# Patient Record
Sex: Male | Born: 1970 | Race: White | Hispanic: No | Marital: Married | Smoking: Never smoker
Health system: Southern US, Community
[De-identification: ages and names within clinical notes are randomized; demographics above are authoritative.]

## PROBLEM LIST (undated history)

## (undated) DIAGNOSIS — R569 Unspecified convulsions: Secondary | ICD-10-CM

## (undated) HISTORY — PX: OTHER SURGICAL HISTORY: SHX169

## (undated) HISTORY — DX: Unspecified convulsions: R56.9

---

## 2013-05-01 ENCOUNTER — Telehealth: Payer: Self-pay | Admitting: Neurology

## 2013-05-01 MED ORDER — PHENYTOIN SODIUM EXTENDED 100 MG PO CAPS: 300.0000 mg | ORAL_CAPSULE | Freq: Every day | ORAL | Status: DC

## 2013-05-01 NOTE — Telephone Encounter (Signed)
The patient called, and he needs a prescription for Dilantin taking 300 mg daily. The patient has not yet been seen through this office, but he has been referred. The patient is going to run out of his medications. I will call in a small prescription for Dilantin.

## 2013-06-08 ENCOUNTER — Encounter: Payer: Self-pay | Admitting: Neurology

## 2013-06-11 ENCOUNTER — Encounter: Payer: Self-pay | Admitting: Neurology

## 2013-06-11 ENCOUNTER — Ambulatory Visit (INDEPENDENT_AMBULATORY_CARE_PROVIDER_SITE_OTHER): Payer: BC Managed Care – PPO | Admitting: Neurology

## 2013-06-11 VITALS — BP 118/84 | HR 80 | Ht 71.0 in | Wt 207.0 lb

## 2013-06-11 DIAGNOSIS — R569 Unspecified convulsions: Secondary | ICD-10-CM | POA: Insufficient documentation

## 2013-06-11 HISTORY — DX: Unspecified convulsions: R56.9

## 2013-06-11 MED ORDER — PHENYTOIN SODIUM EXTENDED 100 MG PO CAPS: 300.0000 mg | ORAL_CAPSULE | Freq: Every day | ORAL | Status: DC

## 2013-06-11 NOTE — Progress Notes (Signed)
Reason for visit: Seizures  Marc Johnson is a 42 y.o. male  History of present illness:  Marc Johnson is a 42 year old right-handed white male with a history of seizures with the onset in 2006. The patient indicates that he will have generalized tonic-clonic seizure events that are preceded by an episode of "zoning out" for about one to 2 minutes. The patient indicates that he may bite his tongue or lose control of the bowels or the bladder. The patient has been placed on Dilantin, and he has done quite well on this medication. The last seizure was in 2008. The patient indicates that he has had 5 total seizures lifetime. The patient recently had a Dilantin level around of 35, and his dosing was reduced from 400 mg daily to 300 mg daily. The patient indicates that a recent Dilantin level was around 18. The patient is feeling well at this time on the medication. The patient indicates that in the past he had a workup for the seizures that included a MRI of the brain, and an EEG study that were normal. The patient indicates that the workup was done in [place], but he has moved to this area more recently. The patient reports that his mother and the maternal grandfather had seizures. The seizures are felt to be genetic in nature. The patient denies any other significant medical issues.  Past Medical History  Diagnosis Date  . Convulsions/seizures 06/11/2013    History reviewed. No pertinent past surgical history.  Family History  Problem Relation Age of Onset  . Seizures Mother   . Seizures Maternal Grandfather     Social history:  reports that he has never smoked. He has never used smokeless tobacco. He reports that he does not drink alcohol or use illicit drugs.  Medications:  No current outpatient prescriptions on file prior to visit.   No current facility-administered medications on file prior to visit.      Allergies  Allergen Reactions  . Dust Mite Extract   . Mold Extract  [Trichophyton]   . Pollen Extract     ROS:  Out of a complete 14 system review of symptoms, the patient complains only of the following symptoms, and all other reviewed systems are negative.  History of seizures Allergies  Blood pressure 118/84, pulse 80, height 5\' 11"  (1.803 m), weight 207 lb (93.895 kg).  Physical Exam  General: The patient is alert and cooperative at the time of the examination.  Head: Pupils are equal, round, and reactive to light. Discs are flat bilaterally.  Neck: The neck is supple, no carotid bruits are noted.  Respiratory: The respiratory examination is clear.  Cardiovascular: The cardiovascular examination reveals a regular rate and rhythm, no obvious murmurs or rubs are noted.  Skin: Extremities are without significant edema.  Neurologic Exam  Mental status:  Cranial nerves: Facial symmetry is present. There is good sensation of the face to pinprick and soft touch bilaterally. The strength of the facial muscles and the muscles to head turning and shoulder shrug are normal bilaterally. Speech is well enunciated, no aphasia or dysarthria is noted. Extraocular movements are full. Visual fields are full.  Motor: The motor testing reveals 5 over 5 strength of all 4 extremities. Good symmetric motor tone is noted throughout.  Sensory: Sensory testing is intact to pinprick, soft touch, vibration sensation, and position sense on all 4 extremities. No evidence of extinction is noted.  Coordination: Cerebellar testing reveals good finger-nose-finger and heel-to-shin bilaterally.  Gait and station: Gait is normal. Tandem gait is normal. Romberg is negative. No drift is seen.  Reflexes: Deep tendon reflexes are symmetric and normal bilaterally, with exception that the ankle jerk reflexes are depressed bilaterally. Toes are downgoing bilaterally.   Assessment/Plan:  1. History seizures  The patient doing well on Dilantin currently, and he indicates that  he had a recent blood level done. The patient will continue on 300 mg of Dilantin daily. The patient is on generic medication. The patient will followup in about one year. He will contact our office if needed. The patient will go on vitamin D supplementation.  Marlan Palau MD 06/11/2013 7:01 PM  [facility] [address] [place], [place] [address]  Phone [phone] Fax [phone]

## 2013-06-11 NOTE — Patient Instructions (Signed)
Begin vitamin D 1000 IU daily. 

## 2013-09-15 ENCOUNTER — Emergency Department (HOSPITAL_COMMUNITY): Payer: BC Managed Care – PPO

## 2013-09-15 ENCOUNTER — Encounter (HOSPITAL_COMMUNITY): Payer: Self-pay | Admitting: Emergency Medicine

## 2013-09-15 ENCOUNTER — Observation Stay (HOSPITAL_COMMUNITY)
Admission: EM | Admit: 2013-09-15 | Discharge: 2013-09-15 | Disposition: A | Discharge status: Home / Self Care | Payer: BC Managed Care – PPO | Attending: Emergency Medicine | Admitting: Emergency Medicine

## 2013-09-15 DIAGNOSIS — M5030 Other cervical disc degeneration, unspecified cervical region: Secondary | ICD-10-CM | POA: Insufficient documentation

## 2013-09-15 DIAGNOSIS — X58XXXA Exposure to other specified factors, initial encounter: Secondary | ICD-10-CM | POA: Insufficient documentation

## 2013-09-15 DIAGNOSIS — G118 Other hereditary ataxias: Secondary | ICD-10-CM | POA: Diagnosis present

## 2013-09-15 DIAGNOSIS — R112 Nausea with vomiting, unspecified: Secondary | ICD-10-CM | POA: Insufficient documentation

## 2013-09-15 DIAGNOSIS — G40909 Epilepsy, unspecified, not intractable, without status epilepticus: Principal | ICD-10-CM | POA: Insufficient documentation

## 2013-09-15 DIAGNOSIS — R569 Unspecified convulsions: Secondary | ICD-10-CM

## 2013-09-15 DIAGNOSIS — S01502A Unspecified open wound of oral cavity, initial encounter: Secondary | ICD-10-CM | POA: Insufficient documentation

## 2013-09-15 DIAGNOSIS — F17200 Nicotine dependence, unspecified, uncomplicated: Secondary | ICD-10-CM | POA: Insufficient documentation

## 2013-09-15 DIAGNOSIS — R42 Dizziness and giddiness: Secondary | ICD-10-CM | POA: Insufficient documentation

## 2013-09-15 DIAGNOSIS — M546 Pain in thoracic spine: Secondary | ICD-10-CM | POA: Insufficient documentation

## 2013-09-15 DIAGNOSIS — W06XXXA Fall from bed, initial encounter: Secondary | ICD-10-CM | POA: Insufficient documentation

## 2013-09-15 LAB — CBC WITH DIFFERENTIAL/PLATELET
BASOS PCT: 0 % (ref 0–1)
Basophils Absolute: 0 10*3/uL (ref 0.0–0.1)
Eosinophils Absolute: 0.1 10*3/uL (ref 0.0–0.7)
Eosinophils Relative: 0 % (ref 0–5)
HCT: 46.6 % (ref 39.0–52.0)
HEMOGLOBIN: 16.3 g/dL (ref 13.0–17.0)
LYMPHS PCT: 8 % — AB (ref 12–46)
Lymphs Abs: 1.1 10*3/uL (ref 0.7–4.0)
MCH: 31.3 pg (ref 26.0–34.0)
MCHC: 35 g/dL (ref 30.0–36.0)
MCV: 89.6 fL (ref 78.0–100.0)
MONO ABS: 1 10*3/uL (ref 0.1–1.0)
MONOS PCT: 7 % (ref 3–12)
NEUTROS PCT: 84 % — AB (ref 43–77)
Neutro Abs: 11.9 10*3/uL — ABNORMAL HIGH (ref 1.7–7.7)
Platelets: 147 10*3/uL — ABNORMAL LOW (ref 150–400)
RBC: 5.2 MIL/uL (ref 4.22–5.81)
RDW: 13.1 % (ref 11.5–15.5)
WBC: 14.1 10*3/uL — AB (ref 4.0–10.5)

## 2013-09-15 LAB — PHENYTOIN LEVEL, TOTAL: PHENYTOIN LVL: 7.1 ug/mL — AB (ref 10.0–20.0)

## 2013-09-15 LAB — BASIC METABOLIC PANEL
BUN: 15 mg/dL (ref 6–23)
CHLORIDE: 103 meq/L (ref 96–112)
CO2: 22 mEq/L (ref 19–32)
CREATININE: 1.01 mg/dL (ref 0.50–1.35)
Calcium: 8.9 mg/dL (ref 8.4–10.5)
GFR calc non Af Amer: 89 mL/min — ABNORMAL LOW (ref >90)
Glucose, Bld: 108 mg/dL — ABNORMAL HIGH (ref 70–99)
POTASSIUM: 4.4 meq/L (ref 3.7–5.3)
Sodium: 142 mEq/L (ref 137–147)

## 2013-09-15 LAB — POCT I-STAT TROPONIN I: Troponin i, poc: 0.02 ng/mL (ref 0.00–0.08)

## 2013-09-15 MED ORDER — ONDANSETRON HCL 4 MG/2ML IJ SOLN: 4.0000 mg | Freq: Three times a day (TID) | INTRAMUSCULAR | Status: DC | PRN

## 2013-09-15 MED ORDER — LORAZEPAM 2 MG/ML IJ SOLN
0.5000 mg | Freq: Once | INTRAMUSCULAR | Status: AC
  Administered 2013-09-15: 0.5 mg via INTRAVENOUS
  Filled 2013-09-15: qty 1

## 2013-09-15 MED ORDER — ONDANSETRON 4 MG PO TBDP: 4.0000 mg | ORAL_TABLET | Freq: Three times a day (TID) | ORAL | Status: DC | PRN

## 2013-09-15 MED ORDER — PHENYTOIN 50 MG PO CHEW: 50.0000 mg | CHEWABLE_TABLET | Freq: Every day | ORAL | Status: DC

## 2013-09-15 MED ORDER — ONDANSETRON 8 MG PO TBDP
8.0000 mg | ORAL_TABLET | Freq: Once | ORAL | Status: AC
  Administered 2013-09-15: 8 mg via ORAL
  Filled 2013-09-15: qty 1

## 2013-09-15 MED ORDER — SODIUM CHLORIDE 0.9 % IV BOLUS (SEPSIS): 1000.0000 mL | Freq: Once | INTRAVENOUS | Status: DC

## 2013-09-15 MED ORDER — HYDROMORPHONE HCL PF 1 MG/ML IJ SOLN: 1.0000 mg | INTRAMUSCULAR | Status: DC | PRN

## 2013-09-15 MED ORDER — ONDANSETRON HCL 4 MG/2ML IJ SOLN: 4.0000 mg | Freq: Once | INTRAMUSCULAR | Status: DC

## 2013-09-15 MED ORDER — SODIUM CHLORIDE 0.9 % IV SOLN
400.0000 mg | Freq: Once | INTRAVENOUS | Status: AC
  Administered 2013-09-15: 400 mg via INTRAVENOUS
  Filled 2013-09-15: qty 8

## 2013-09-15 NOTE — Discharge Instructions (Signed)
Take the prescribed medication as directed in addition to your other dilantin tablets. Follow-up with your neurologist to discuss this ED visit and medication adjustment. Return to the ED for new or worsening symptoms.
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
## 2013-09-15 NOTE — ED Provider Notes (Signed)
Medical screening examination/treatment/procedure(s) were conducted as a shared visit with non-physician practitioner(s) and myself.  I personally evaluated the patient during the encounter.  EKG Interpretation    Date/Time:  Saturday September 15 2013 07:11:42 EST Ventricular Rate:  96 PR Interval:  162 QRS Duration: 95 QT Interval:  339 QTC Calculation: 428 R Axis:   11 Text Interpretation:  Age not entered, assumed to be  43 years old for purpose of ECG interpretation Sinus rhythm Nonspecific T abnormalities, anterior and lateral leads Confirmed by HARRISON  MD, FORREST ([id number]) on 09/15/2013 7:18:58 AM           Pt seen with PA.  H/o seizure disorder, had seizure this am, witnessed by wife.  Pt with minor tongue injury.  Mid back pain.  No missed medications.  Fell from bed.  Plan for ct head, cspine, and thoracic spine films.  Labs, dilantin level pending.    Olivia Mackielga M Trinidad Petron, MD 09/15/13 2056

## 2013-09-15 NOTE — ED Notes (Signed)
Tolerating po fluids

## 2013-09-15 NOTE — ED Notes (Signed)
Bed: RESB Expected date:  Expected time:  Means of arrival:  Comments: seizure 

## 2013-09-15 NOTE — ED Notes (Signed)
Pt was getting up to d/c home.  Became nauseated and vomited at bedside.  Orthostatic vs completed. Notified PA.  Ok to start with po challenge, po med and cancel IV.  Pt given gingerale.  Wife at bedside.
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
## 2013-09-15 NOTE — ED Notes (Signed)
Pt in radiology via stretcher at this time.

## 2013-09-15 NOTE — ED Notes (Signed)
Seizure pads in place. Pt has been assessed by MD

## 2013-09-15 NOTE — ED Provider Notes (Signed)
CSN: [id number]     Arrival date & time 09/15/13  4098 History   None    Chief Complaint  Patient presents with  . Seizures   (Consider location/radiation/quality/duration/timing/severity/associated sxs/prior Treatment) The history is provided by the patient and medical records.   This is a 43 y.o. M with PMH significant for seizure d/o presenting to the ED via EMS for witnessed seizure PTA.  Pt awoke wife around 0540 with garbled speech and rigid movements.  Pt then fell off the bed, unsure of head trauma or LOC.  On EMS arrival, pt was post-ictal with limited responsiveness.  On arrival to the ED, pt still appears somewhat disoriented.  Pt has evidence of oral trauma and urinary incontinence.  Pt states he does not recall any events leading up to ER arrival.  Pt is complaining of some back pain and nausea.  Pt takes dilantin 300mg  daily.  Pt was previously on 400mg  daily but states his levels were too high so was reduced to 300mg  daily which he states this has been working well for him-- last seizure was in 2008.  Denies any recent EtOH or illicit drug use.  Denies any chest pain, SOB, or palpitations. Neurology-- Dr. Anne Hahn with [facility]  Past Medical History  Diagnosis Date  . Convulsions/seizures 06/11/2013   History reviewed. No pertinent past surgical history. Family History  Problem Relation Age of Onset  . Seizures Mother   . Seizures Maternal Grandfather    History  Substance Use Topics  . Smoking status: Current Every Day Smoker  . Smokeless tobacco: Never Used  . Alcohol Use: No    Review of Systems  Neurological: Positive for seizures.  All other systems reviewed and are negative.    Allergies  Dust mite extract; Mold extract; and Pollen extract  Home Medications   Current Outpatient Rx  Name  Route  Sig  Dispense  Refill  . cholecalciferol (VITAMIN D) 1000 UNITS tablet   Oral   Take 1,000 Units by mouth daily.         . phenytoin (DILANTIN) 100  MG ER capsule   Oral   Take 3 capsules (300 mg total) by mouth daily.   90 capsule   11    BP 112/75  Pulse 86  Temp(Src) 97.8 F (36.6 C) (Rectal)  Resp 20  SpO2 100%  Physical Exam  Nursing note and vitals reviewed. Constitutional: He is oriented to person, place, and time. He appears well-developed and well-nourished. No distress.  HENT:  Head: Normocephalic and atraumatic.  Mouth/Throat: Uvula is midline and oropharynx is clear and moist. Mucous membranes are dry. Lacerations present.    Laceration to anterior tip of tongue without active bleeding; dried blood on lips and roof of mouth; dry mucus membranes  Eyes: Conjunctivae and EOM are normal. Pupils are equal, round, and reactive to light.  Neck: Normal range of motion. Neck supple.  Cardiovascular: Normal rate, regular rhythm and normal heart sounds.   Pulmonary/Chest: Effort normal and breath sounds normal. No respiratory distress. He has no wheezes.  Abdominal: Soft. Bowel sounds are normal. There is no tenderness. There is no guarding.  Musculoskeletal: Normal range of motion.       Thoracic back: He exhibits tenderness, bony tenderness and pain. He exhibits normal range of motion, no swelling, no edema, no deformity, no laceration, no spasm and normal pulse.  TTP of lower thoracic spine; no midline step-off or gross deformity; full ROM maintained with some pain; moves  BLE without ataxia; distal sensation intact  Neurological: He is alert and oriented to person, place, and time. He has normal strength. He displays no tremor. No cranial nerve deficit or sensory deficit. He displays no seizure activity.  AAOx3, answering questions appropriately; equal strength UE and LE bilaterally; CN grossly intact; moves all extremities appropriately without ataxia; no focal neuro deficits or facial asymmetry appreciated  Skin: Skin is warm and dry. He is not diaphoretic.  Psychiatric: He has a normal mood and affect.    ED Course    Procedures (including critical care time) Labs Review Labs Reviewed  CBC WITH DIFFERENTIAL - Abnormal; Notable for the following:    WBC 14.1 (*)    Platelets 147 (*)    Neutrophils Relative % 84 (*)    Neutro Abs 11.9 (*)    Lymphocytes Relative 8 (*)    All other components within normal limits  BASIC METABOLIC PANEL - Abnormal; Notable for the following:    Glucose, Bld 108 (*)    GFR calc non Af Amer 89 (*)    All other components within normal limits  PHENYTOIN LEVEL, TOTAL - Abnormal; Notable for the following:    Phenytoin Lvl 7.1 (*)    All other components within normal limits  POCT I-STAT TROPONIN I   Imaging Review Dg Thoracic Spine 2 View  09/15/2013   CLINICAL DATA:  Fall, back pain  EXAM: THORACIC SPINE - 2 VIEW  COMPARISON:  None.  FINDINGS: There is no evidence of thoracic spine fracture. Alignment is normal. No other significant bone abnormalities are identified.  IMPRESSION: Negative.   Electronically Signed   By: Ruel Favorsrevor  Shick M.D.   On: 09/15/2013 07:09   Ct Head Wo Contrast  09/15/2013   CLINICAL DATA:  Episode of garbled speech, possible seizure, and fall from bed approximately 1-1/2 hr ago. Patient unresponsive upon EMS arrival.  EXAM: CT HEAD WITHOUT CONTRAST  CT CERVICAL SPINE WITHOUT CONTRAST  TECHNIQUE: Multidetector CT imaging of the head and cervical spine was performed following the standard protocol without intravenous contrast. Multiplanar CT image reconstructions of the cervical spine were also generated.  COMPARISON:  None.  FINDINGS: CT HEAD FINDINGS  Ventricular system normal in size and appearance for age. No mass lesion. No midline shift. No acute hemorrhage or hematoma. No extra-axial fluid collections. No acute infarction. No focal brain parenchymal abnormality.  No skull fractures or other focal osseous abnormality involving the skull. Visualized paranasal sinuses, bilateral mastoid air cells, and bilateral middle ear cavities well-aerated.  CT  CERVICAL SPINE FINDINGS  No fractures identified involving the cervical spine. Sagittal reconstructed images demonstrate anatomic posterior alignment. No evidence of spinal stenosis. Neural foramina widely patent throughout. Facet joints intact throughout. Mild disc space narrowing at C5-6. Remaining disc spaces well preserved, and no significant disc protrusion identified on the soft tissue windows. Coronal reformatted images demonstrate an intact craniocervical junction, intact dens and intact lateral masses throughout.  IMPRESSION: 1. Normal unenhanced CT head. 2. No cervical spine fractures identified. Mild disc space narrowing at C5-6 without significant disc protrusion.   Electronically Signed   By: Hulan Saashomas  Lawrence M.D.   On: 09/15/2013 07:19   Ct Cervical Spine Wo Contrast  09/15/2013   CLINICAL DATA:  Episode of garbled speech, possible seizure, and fall from bed approximately 1-1/2 hr ago. Patient unresponsive upon EMS arrival.  EXAM: CT HEAD WITHOUT CONTRAST  CT CERVICAL SPINE WITHOUT CONTRAST  TECHNIQUE: Multidetector CT imaging of the head and cervical  spine was performed following the standard protocol without intravenous contrast. Multiplanar CT image reconstructions of the cervical spine were also generated.  COMPARISON:  None.  FINDINGS: CT HEAD FINDINGS  Ventricular system normal in size and appearance for age. No mass lesion. No midline shift. No acute hemorrhage or hematoma. No extra-axial fluid collections. No acute infarction. No focal brain parenchymal abnormality.  No skull fractures or other focal osseous abnormality involving the skull. Visualized paranasal sinuses, bilateral mastoid air cells, and bilateral middle ear cavities well-aerated.  CT CERVICAL SPINE FINDINGS  No fractures identified involving the cervical spine. Sagittal reconstructed images demonstrate anatomic posterior alignment. No evidence of spinal stenosis. Neural foramina widely patent throughout. Facet joints intact  throughout. Mild disc space narrowing at C5-6. Remaining disc spaces well preserved, and no significant disc protrusion identified on the soft tissue windows. Coronal reformatted images demonstrate an intact craniocervical junction, intact dens and intact lateral masses throughout.  IMPRESSION: 1. Normal unenhanced CT head. 2. No cervical spine fractures identified. Mild disc space narrowing at C5-6 without significant disc protrusion.   Electronically Signed   By: Hulan Saas M.D.   On: 09/15/2013 07:19    EKG Interpretation    Date/Time:  Saturday September 15 2013 07:11:42 EST Ventricular Rate:  96 PR Interval:  162 QRS Duration: 95 QT Interval:  339 QTC Calculation: 428 R Axis:   11 Text Interpretation:  Age not entered, assumed to be  43 years old for purpose of ECG interpretation Sinus rhythm Nonspecific T abnormalities, anterior and lateral leads Confirmed by HARRISON  MD, FORREST ([id number]) on 09/15/2013 7:18:58 AM            MDM   1. Seizure    Pt with witness tonic-clonic seizure activity at home resulting in fall from bed.  Pt somewhat disoriented on arrival but beginning to become more alert.  Will obtain CT head and CS, thoracic x-ray due to fall and basic blood work including dilantin level.  Pt given 0.5mg  ativan.  Will continue to monitor closely.  CT head and CS negative for acute findings.  EKG NSR, no acute ischemic chang.es Labs as above-- dilantin level low at 7.1.  Discussed case with on call neurology, Dr. Susanne Borders to load with 400mg  Dilantin IV, increase home daily dose to 350mg .  11:47 AM Dilantin has finished.  Pt has been resting comfortably in room without further tremors or seizure activity.  Pt states he feels ok, just tired.  Will increase medications per neurology recommendations.  Pt will FU with his neurologist, Dr. Anne Hahn as soon as possible to discuss this ED visit and medication changes.  Pt will be on driving restrictions until cleared by  neurology.  Discussed plan of care with pt, he acknowledged understanding and agreed.  Return precautions advised.  12:36 PM At time of discharge, pt stood up got somewhat dizzy, nauseated with an episode of vomiting.  Orthostatic VS positive.  Will give additional fluids and zofran.  1:55 PM Pt able to fully stand without recurrent dizziness, nausea, or vomiting.  Pt states he feels fine, just remains sleepy.  Neuro exam remains WNL without concerning focal deficits.  Pt will be discharged with prior FU plan and restrictions.  Garlon Hatchet, PA-C 09/15/13 1440

## 2013-09-17 ENCOUNTER — Telehealth: Payer: Self-pay | Admitting: Neurology

## 2013-09-17 NOTE — Telephone Encounter (Signed)
I called and made appt for pt on Wednesday with L Lam, NP at 0930 RV post seizure on Saturday last.

## 2013-09-17 NOTE — Telephone Encounter (Signed)
Calling back to change phone number to cell if not reached on work number-Melissa

## 2013-09-19 ENCOUNTER — Ambulatory Visit (INDEPENDENT_AMBULATORY_CARE_PROVIDER_SITE_OTHER): Payer: BC Managed Care – PPO | Admitting: Nurse Practitioner

## 2013-09-19 ENCOUNTER — Encounter: Payer: Self-pay | Admitting: Nurse Practitioner

## 2013-09-19 VITALS — BP 127/78 | HR 84

## 2013-09-19 DIAGNOSIS — R569 Unspecified convulsions: Secondary | ICD-10-CM

## 2013-09-19 MED ORDER — PHENYTOIN 50 MG PO CHEW: 50.0000 mg | CHEWABLE_TABLET | Freq: Every day | ORAL | Status: DC

## 2013-09-19 NOTE — Progress Notes (Signed)
PATIENT: Marc Johnson DOB: 07/26/1971   REASON FOR VISIT: follow up for epilepsy HISTORY FROM: patient  HISTORY OF PRESENT ILLNESS: Marc Johnson is a 43 year old right-handed white male with a history of seizures with the onset in 2006. The patient indicates that he will have generalized tonic-clonic seizure events that are preceded by an episode of "zoning out" for about one to 2 minutes. The patient indicates that he may bite his tongue or lose control of the bowels or the bladder. The patient has been placed on Dilantin, and he has done quite well on this medication. The last seizure was in 2008. The patient indicates that he has had 5 total seizures lifetime. The patient recently had a Dilantin level around of 35, and his dosing was reduced from 400 mg daily to 300 mg daily. The patient indicates that a recent Dilantin level was around 18. The patient is feeling well at this time on the medication. The patient indicates that in the past he had a workup for the seizures that included a MRI of the brain, and an EEG study that were normal. The patient indicates that the workup was done in [place], but he has moved to this area more recently. The patient reports that his mother and the maternal grandfather had seizures. The seizures are felt to be genetic in nature. The patient denies any other significant medical issues.  UPDATE Patient was taken to ER on 09/15/13, via EMS for witnessed seizure PTA. Pt awoke wife around 0540 with garbled speech and rigid movements. Pt then fell off the bed, unsure of head trauma or LOC. On EMS arrival, pt was post-ictal with limited responsiveness. On arrival to the ED, pt was somewhat disoriented. Pt had evidence of oral trauma and urinary incontinence. Pt states he does not recall any events leading up to ER arrival.  Pt was taking dilantin 300mg  daily. Pt was previously on 400mg  daily but states his levels were too high so was reduced to 300mg  daily which he  states this has been working well for him -- last seizure was in 2008. Denies any recent EtOH or illicit drug use.  Denies missed doses.  He was however fasting for religious reasons, only eating one meal per day in evening.  REVIEW OF SYSTEMS: Full 14 system review of systems performed and notable only for:  Neurological: seizure  ALLERGIES: Allergies  Allergen Reactions  . Dust Mite Extract   . Mold Extract [Trichophyton]   . Pollen Extract     HOME MEDICATIONS: Outpatient Prescriptions Prior to Visit  Medication Sig Dispense Refill  . cholecalciferol (VITAMIN D) 1000 UNITS tablet Take 1,000 Units by mouth daily.      . ondansetron (ZOFRAN ODT) 4 MG disintegrating tablet Take 1 tablet (4 mg total) by mouth every 8 (eight) hours as needed for nausea.  10 tablet  0  . phenytoin (DILANTIN) 100 MG ER capsule Take 300 mg by mouth at bedtime.      . phenytoin (DILANTIN) 50 MG tablet Chew 1 tablet (50 mg total) by mouth daily.  30 tablet  0   No facility-administered medications prior to visit.    PAST MEDICAL HISTORY: Past Medical History  Diagnosis Date  . Convulsions/seizures 06/11/2013    PAST SURGICAL HISTORY: Past Surgical History  Procedure Laterality Date  . None      FAMILY HISTORY: Family History  Problem Relation Age of Onset  . Seizures Mother   . Seizures Maternal Grandfather  SOCIAL HISTORY: History   Social History  . Marital Status: Married    Spouse Name: Efraim Kaufmann    Number of Children: 1  . Years of Education: college   Occupational History  .  Time [employer]   Social History Main Topics  . Smoking status: Never Smoker   . Smokeless tobacco: Never Used  . Alcohol Use: No  . Drug Use: No  . Sexual Activity: Not on file   Other Topics Concern  . Not on file   Social History Narrative  . No narrative on file     PHYSICAL EXAM  Filed Vitals:   09/19/13 0941  BP: 127/78  Pulse: 84   There is no weight on file to calculate  BMI.  Generalized: Well developed, in no acute distress  Head: normocephalic and atraumatic. Oropharynx benign  Neck: Supple, no carotid bruits  Cardiac: Regular rate rhythm, no murmur  Musculoskeletal: No deformity   Neurological examination   Mentation: Alert oriented to time, place, history taking. Follows all commands speech and language fluent Cranial nerve II-XII: Fundoscopic exam reveals sharp disc margins.Pupils were equal round reactive to light extraocular movements were full, visual field were full on confrontational test. Facial sensation and strength were normal. hearing was intact to finger rubbing bilaterally. Uvula tongue midline. head turning and shoulder shrug and were normal and symmetric.Tongue protrusion into cheek strength was normal. Motor: normal bulk and tone, full strength in the BUE, BLE, fine finger movements normal, no pronator drift. No focal weakness Sensory: normal and symmetric to light touch, pinprick, and  vibration  Coordination: finger-nose-finger, heel-to-shin bilaterally, no dysmetria Reflexes:  Deep tendon reflexes in the upper and lower extremities are present and symmetric.  Gait and Station: Rising up from seated position without assistance, normal stance, without trunk ataxia, moderate stride, good arm swing, smooth turning, able to perform tiptoe, and heel walking without difficulty.   DIAGNOSTIC DATA (LABS, IMAGING, TESTING) - I reviewed patient records, labs, notes, testing and imaging myself where available.  Lab Results  Component Value Date   WBC 14.1* 09/15/2013   HGB 16.3 09/15/2013   HCT 46.6 09/15/2013   MCV 89.6 09/15/2013   PLT 147* 09/15/2013      Component Value Date/Time   NA 142 09/15/2013 0721   K 4.4 09/15/2013 0721   CL 103 09/15/2013 0721   CO2 22 09/15/2013 0721   GLUCOSE 108* 09/15/2013 0721   BUN 15 09/15/2013 0721   CREATININE 1.01 09/15/2013 0721   CALCIUM 8.9 09/15/2013 0721   GFRNONAA 89* 09/15/2013 0721   GFRAA >90  09/15/2013 0721   Phenytoin 09/15/13  7.1  ASSESSMENT AND PLAN 43 y.o. year old male  has a past medical history of Convulsions/seizures (06/11/2013) here with recent breakthrough seizure after several days of eating only one meal per day.  Phenytoin was increased from 300 mg to 350 mg.  PLAN: Continue Phenytion ER 300 mg daily at bedtime.  Start taking your Phenytoin 50 mg in the morning.  Take Vitamin  D supplement separately from Phenytoin by 2 hours. We will check Phenytoin blood level today. Acccording to the [facility], no driving until 6 months seizure-free. Follow up in 6 months with Dr. Anne Hahn, sooner as needed.  Orders Placed This Encounter  Procedures  . Phenytoin Level, Total   Meds ordered this encounter  Medications  . phenytoin (DILANTIN) 50 MG tablet    Sig: Chew 1 tablet (50 mg total) by mouth daily. In the Morning.  Dispense:  90 tablet    Refill:  3    Order Specific Question:  Supervising Provider    Answer:  York Spaniel [[id number]]   Return in about 6 months (around 03/19/2014).  Ronal Fear, MSN, NP-C 09/19/2013, 11:56 AM [facility] [address], [address] [place], [place] [address] [phone]  Note: This document was prepared with digital dictation and possible smart phrase technology. Any transcriptional errors that result from this process are unintentional.

## 2013-09-19 NOTE — Patient Instructions (Signed)
Continue Phenytion ER 300 mg  Daily at bedtime.  Start taking your Phenytoin 50 mg in the morning.  We will check blood level today.  Follow up in 6 months with Dr. Anne HahnWillis, sooner as needed.

## 2013-09-20 LAB — PHENYTOIN LEVEL, TOTAL: Phenytoin Lvl: 11.4 ug/mL (ref 10.0–20.0)

## 2013-09-20 NOTE — Progress Notes (Signed)
Quick Note:  Spoke to patient and relayed Phenytoin level at 11.4, therapeutic range, per [employer]. He will keep current dosage. ______

## 2014-02-18 ENCOUNTER — Encounter: Payer: Self-pay | Admitting: Neurology

## 2014-02-18 ENCOUNTER — Telehealth: Payer: Self-pay | Admitting: Neurology

## 2014-02-18 ENCOUNTER — Ambulatory Visit (INDEPENDENT_AMBULATORY_CARE_PROVIDER_SITE_OTHER): Payer: BC Managed Care – PPO | Admitting: Neurology

## 2014-02-18 VITALS — BP 115/72 | HR 72 | Wt 210.0 lb

## 2014-02-18 DIAGNOSIS — R569 Unspecified convulsions: Secondary | ICD-10-CM

## 2014-02-18 MED ORDER — PHENYTOIN SODIUM EXTENDED 100 MG PO CAPS: 300.0000 mg | ORAL_CAPSULE | Freq: Every day | ORAL | Status: DC

## 2014-02-18 MED ORDER — PHENYTOIN 50 MG PO CHEW: 50.0000 mg | CHEWABLE_TABLET | Freq: Every day | ORAL | Status: DC

## 2014-02-18 NOTE — Telephone Encounter (Signed)
Appointment scheduled.

## 2014-02-18 NOTE — Telephone Encounter (Signed)
Pt's wife called would like for someone to call her back to see if pt can get in today, had to call 911 this morning thought he might had a mild seizure, but like to get some things check. Please call pt's wife to call back concerning this matter. Thanks

## 2014-02-18 NOTE — Patient Instructions (Signed)
Epilepsy Epilepsy is a disorder in which a person has repeated seizures over time. A seizure is a release of abnormal electrical activity in the brain. Seizures can cause a change in attention, behavior, or the ability to remain awake and alert (altered mental status). Seizures often involve uncontrollable shaking (convulsions).  Most people with epilepsy lead normal lives. However, people with epilepsy are at an increased risk of falls, accidents, and injuries. Therefore, it is important to begin treatment right away. CAUSES  Epilepsy has many possible causes. Anything that disturbs the normal pattern of brain cell activity can lead to seizures. This may include:   Head injury.  Birth trauma.  High fever as a child.  Stroke.  Bleeding into or around the brain.  Certain drugs.  Prolonged low oxygen, such as what occurs after CPR efforts.  Abnormal brain development.  Certain illnesses, such as meningitis, encephalitis (brain infection), malaria, and other infections.  An imbalance of nerve signaling chemicals (neurotransmitters).  SIGNS AND SYMPTOMS  The symptoms of a seizure can vary greatly from one person to another. Right before a seizure, you may have a warning (aura) that a seizure is about to occur. An aura may include the following symptoms:  Fear or anxiety.  Nausea.  Feeling like the room is spinning (vertigo).  Vision changes, such as seeing flashing lights or spots. Common symptoms during a seizure include:  Abnormal sensations, such as an abnormal smell or a bitter taste in the mouth.   Sudden, general body stiffness.   Convulsions that involve rhythmic jerking of the face, arm, or leg on one or both sides.   Sudden change in consciousness.   Appearing to be awake but not responding.   Appearing to be asleep but cannot be awakened.   Grimacing, chewing, lip smacking, drooling, tongue biting, or loss of bowel or bladder control. After a seizure,  you may feel sleepy for a while. DIAGNOSIS  Your health care provider will ask about your symptoms and take a medical history. Descriptions from any witnesses to your seizures will be very helpful in the diagnosis. A physical exam, including a detailed neurological exam, is necessary. Various tests may be done, such as:   An electroencephalogram (EEG). This is a painless test of your brain waves. In this test, a diagram is created of your brain waves. These diagrams can be interpreted by a specialist.  An MRI of the brain.   A CT scan of the brain.   A spinal tap (lumbar puncture, LP).  Blood tests to check for signs of infection or abnormal blood chemistry. TREATMENT  There is no cure for epilepsy, but it is generally treatable. Once epilepsy is diagnosed, it is important to begin treatment as soon as possible. For most people with epilepsy, seizures can be controlled with medicines. The following may also be used:  A pacemaker for the brain (vagus nerve stimulator) can be used for people with seizures that are not well controlled by medicine.  Surgery on the brain. For some people, epilepsy eventually goes away. HOME CARE INSTRUCTIONS   Follow your health care provider's recommendations on driving and safety in normal activities.  Get enough rest. Lack of sleep can cause seizures.  Only take over-the-counter or prescription medicines as directed by your health care provider. Take any prescribed medicine exactly as directed.  Avoid any known triggers of your seizures.  Keep a seizure diary. Record what you recall about any seizure, especially any possible trigger.   Make   sure the people you live and work with know that you are prone to seizures. They should receive instructions on how to help you. In general, a witness to a seizure should:   Cushion your head and body.   Turn you on your side.   Avoid unnecessarily restraining you.   Not place anything inside your  mouth.   Call for emergency medical help if there is any question about what has occurred.   Follow up with your health care provider as directed. You may need regular blood tests to monitor the levels of your medicine.  SEEK MEDICAL CARE IF:   You develop signs of infection or other illness. This might increase the risk of a seizure.   You seem to be having more frequent seizures.   Your seizure pattern is changing.  SEEK IMMEDIATE MEDICAL CARE IF:   You have a seizure that does not stop after a few moments.   You have a seizure that causes any difficulty in breathing.   You have a seizure that results in a very severe headache.   You have a seizure that leaves you with the inability to speak or use a part of your body.  Document Released: 08/23/2005 Document Revised: 06/13/2013 Document Reviewed: 04/04/2013 [employer] Patient Information 2014 [employer].  

## 2014-02-18 NOTE — Progress Notes (Signed)
Reason for visit: Seizures  Marc Johnson is an 43 y.o. male  History of present illness:  Marc Johnson is a 43 year old right-handed white male with a history of seizures. The patient has a family history of seizures, they are felt to be genetic in origin. The patient has been on Dilantin, well controlled for quite a number of years. In January 2015, the patient had a seizure out of sleep associated with tongue biting and bladder incontinence. The patient was increased on the Dilantin taking 350 mg daily. On 400 mg day, the patient was running toxic Dilantin levels. The patient however, has not been taking the 50 mg tablet regularly, and he essentially has been on only 300 mg of Dilantin daily. He suffered another seizure last evening at 2:30 AM. The patient was confused for about 15 minutes. The patient will sit up out of bed, and he will be unresponsive for several minutes. The patient had no generalized jerking, no tongue biting, no bowel or bladder incontinence. The patient indicates that he is been under increased stress with his work. The patient has been getting adequate sleep. The patient has been operating a motor vehicle.  Past Medical History  Diagnosis Date  . Convulsions/seizures 06/11/2013    Past Surgical History  Procedure Laterality Date  . None      Family History  Problem Relation Age of Onset  . Seizures Mother   . Seizures Maternal Grandfather     Social history:  reports that he has never smoked. He has never used smokeless tobacco. He reports that he does not drink alcohol or use illicit drugs.    Allergies  Allergen Reactions  . Dust Mite Extract   . Mold Extract [Trichophyton]   . Pollen Extract     Medications:  Current Outpatient Prescriptions on File Prior to Visit  Medication Sig Dispense Refill  . cholecalciferol (VITAMIN D) 1000 UNITS tablet Take 1,000 Units by mouth daily.      . ondansetron (ZOFRAN ODT) 4 MG disintegrating tablet Take 1  tablet (4 mg total) by mouth every 8 (eight) hours as needed for nausea.  10 tablet  0   No current facility-administered medications on file prior to visit.    ROS:  Out of a complete 14 system review of symptoms, the patient complains only of the following symptoms, and all other reviewed systems are negative.  Appetite change Acting out drains Environmental allergies Seizures  Blood pressure 115/72, pulse 72, weight 210 lb (95.255 kg).  Physical Exam  General: The patient is alert and cooperative at the time of the examination.  Skin: No significant peripheral edema is noted.   Neurologic Exam  Mental status: The patient is oriented x 3.  Cranial nerves: Facial symmetry is present. Speech is normal, no aphasia or dysarthria is noted. Extraocular movements are full. Visual fields are full.  Motor: The patient has good strength in all 4 extremities.  Sensory examination: Soft touch sensation is symmetric on the face, arms, and legs.  Coordination: The patient has good finger-nose-finger and heel-to-shin bilaterally.  Gait and station: The patient has a normal gait. Tandem gait is normal. Romberg is negative. No drift is seen.  Reflexes: Deep tendon reflexes are symmetric.   Assessment/Plan:  1. History seizures  The patient's had recurrence of seizures. He has not been on the 350 mg dosing of Dilantin. We will have him go back to 350 mg in the evening of Dilantin, and he will followup  in July 2015. We will check blood levels at that time. The patient is not to operate a motor vehicle until further notice, we will wait at least 6 months before letting him drive again.   Marc Johnson. Keith Avyaan Summer MD 02/18/2014 7:47 PM  [facility] [address] [place], [place] [id number]  Phone [phone] Fax [phone]

## 2014-03-22 ENCOUNTER — Encounter (INDEPENDENT_AMBULATORY_CARE_PROVIDER_SITE_OTHER): Payer: Self-pay

## 2014-03-22 ENCOUNTER — Encounter: Payer: Self-pay | Admitting: Neurology

## 2014-03-22 ENCOUNTER — Ambulatory Visit (INDEPENDENT_AMBULATORY_CARE_PROVIDER_SITE_OTHER): Payer: BC Managed Care – PPO | Admitting: Neurology

## 2014-03-22 VITALS — BP 115/82 | HR 84 | Wt 208.0 lb

## 2014-03-22 DIAGNOSIS — R569 Unspecified convulsions: Secondary | ICD-10-CM

## 2014-03-22 DIAGNOSIS — Z5181 Encounter for therapeutic drug level monitoring: Secondary | ICD-10-CM

## 2014-03-22 NOTE — Progress Notes (Signed)
    Reason for visit: Seizures  Marc ReamsBrian Abishai Johnson is an 43 y.o. male  History of present illness:  Marc Johnson is a 43 year old right-handed white male with a history of seizures. The patient has last had a seizure on 02/17/2014. The patient had a low therapeutic level of Dilantin. He has been increased to a 300 mg dose of Dilantin. He has had some problems with concentration while working, but otherwise has not had any problems with balance. He is allergic to brand name Dilantin, but not to the generic phenytoin. The patient otherwise is doing well, no recurrent seizures have been noted since last seen. He is not currently operating a motor vehicle.  Past Medical History  Diagnosis Date  . Convulsions/seizures 06/11/2013    Past Surgical History  Procedure Laterality Date  . None      Family History  Problem Relation Age of Onset  . Seizures Mother   . Seizures Maternal Grandfather     Social history:  reports that he has never smoked. He has never used smokeless tobacco. He reports that he does not drink alcohol or use illicit drugs.    Allergies  Allergen Reactions  . Dust Mite Extract   . Mold Extract [Trichophyton]   . Pollen Extract     Medications:  Current Outpatient Prescriptions on File Prior to Visit  Medication Sig Dispense Refill  . cholecalciferol (VITAMIN D) 1000 UNITS tablet Take 1,000 Units by mouth daily.      . ondansetron (ZOFRAN ODT) 4 MG disintegrating tablet Take 1 tablet (4 mg total) by mouth every 8 (eight) hours as needed for nausea.  10 tablet  0  . phenytoin (DILANTIN) 100 MG ER capsule Take 3 capsules (300 mg total) by mouth at bedtime.  270 capsule  3  . phenytoin (DILANTIN) 50 MG tablet Chew 1 tablet (50 mg total) by mouth daily. In the Morning.  90 tablet  3   No current facility-administered medications on file prior to visit.    ROS:  Out of a complete 14 system review of symptoms, the patient complains only of the following symptoms,  and all other reviewed systems are negative.  Environmental allergies Frequent waking, snoring, shift work Hyperactive  Blood pressure 115/82, pulse 84, weight 208 lb (94.348 kg).  Physical Exam  General: The patient is alert and cooperative at the time of the examination.  Skin: No significant peripheral edema is noted.   Neurologic Exam  Mental status: The patient is oriented x 3.  Cranial nerves: Facial symmetry is present. Speech is normal, no aphasia or dysarthria is noted. Extraocular movements are full. Visual fields are full.  Motor: The patient has good strength in all 4 extremities.  Sensory examination: Soft touch sensation is symmetric on the face, arms, or legs.  Coordination: The patient has good finger-nose-finger and heel-to-shin bilaterally.  Gait and station: The patient has a normal gait. Tandem gait is normal. Romberg is negative. No drift is seen.  Reflexes: Deep tendon reflexes are symmetric.   Assessment/Plan:  1. History seizures  The patient will be sent for a Dilantin level today. This does represent a trough level for him. The patient will followup otherwise in 4 months. He is not to operate a motor vehicle for 6 months following his last seizure in June 2015.  Marc Palau. Marc Willis MD 03/24/2014 9:47 AM  [facility] [address] [place], [place] [id number]  Phone [phone] Fax [phone]

## 2014-03-22 NOTE — Patient Instructions (Signed)

## 2014-03-23 LAB — PHENYTOIN LEVEL, TOTAL: Phenytoin Lvl: 14.8 ug/mL (ref 10.0–20.0)

## 2014-03-25 NOTE — Progress Notes (Signed)
Quick Note:  Shared lab results with patient and he verbalized understanding ______

## 2014-06-11 ENCOUNTER — Ambulatory Visit: Payer: BC Managed Care – PPO | Admitting: Nurse Practitioner

## 2014-12-29 ENCOUNTER — Encounter (HOSPITAL_COMMUNITY): Payer: Self-pay | Admitting: *Deleted

## 2014-12-29 ENCOUNTER — Emergency Department (HOSPITAL_COMMUNITY): Payer: BLUE CROSS/BLUE SHIELD

## 2014-12-29 ENCOUNTER — Emergency Department (HOSPITAL_COMMUNITY)
Admission: EM | Admit: 2014-12-29 | Discharge: 2014-12-29 | Disposition: A | Discharge status: Home / Self Care | Payer: BLUE CROSS/BLUE SHIELD | Attending: Emergency Medicine | Admitting: Emergency Medicine

## 2014-12-29 DIAGNOSIS — R569 Unspecified convulsions: Secondary | ICD-10-CM | POA: Insufficient documentation

## 2014-12-29 DIAGNOSIS — Z79899 Other long term (current) drug therapy: Secondary | ICD-10-CM | POA: Insufficient documentation

## 2014-12-29 DIAGNOSIS — J209 Acute bronchitis, unspecified: Secondary | ICD-10-CM | POA: Insufficient documentation

## 2014-12-29 DIAGNOSIS — R05 Cough: Secondary | ICD-10-CM | POA: Diagnosis present

## 2014-12-29 DIAGNOSIS — J40 Bronchitis, not specified as acute or chronic: Secondary | ICD-10-CM

## 2014-12-29 MED ORDER — DEXAMETHASONE SODIUM PHOSPHATE 10 MG/ML IJ SOLN
5.0000 mg | Freq: Once | INTRAMUSCULAR | Status: AC
  Administered 2014-12-29: 5 mg via INTRAMUSCULAR
  Filled 2014-12-29: qty 1

## 2014-12-29 MED ORDER — IBUPROFEN 800 MG PO TABS
800.0000 mg | ORAL_TABLET | Freq: Once | ORAL | Status: AC
  Administered 2014-12-29: 800 mg via ORAL
  Filled 2014-12-29: qty 1

## 2014-12-29 MED ORDER — ALBUTEROL SULFATE HFA 108 (90 BASE) MCG/ACT IN AERS
2.0000 | INHALATION_SPRAY | Freq: Once | RESPIRATORY_TRACT | Status: AC
  Administered 2014-12-29: 2 via RESPIRATORY_TRACT
  Filled 2014-12-29: qty 6.7

## 2014-12-29 NOTE — ED Provider Notes (Signed)
CSN: [id number]     Arrival date & time 12/29/14  1016 History   First MD Initiated Contact with Patient 12/29/14 1025     Chief Complaint  Patient presents with  . Cough  . Fever     (Consider location/radiation/quality/duration/timing/severity/associated sxs/prior Treatment) HPI  Marc Johnson is a 44 y.o. male with PMH of seizures presenting with one week of cough productive of thick green sputum as well as fevers with MAXIMUM TEMPERATURE of 101.8. Patient presented to medical clinic 6 days ago and was started on doxycycline. He should states his cough is worse with lying down has been unrelieved by over-the-counter cold medications. Patient also endorses rhinorrhea but no sore throat or shortness of breath. No chest pain.   Past Medical History  Diagnosis Date  . Convulsions/seizures 06/11/2013   Past Surgical History  Procedure Laterality Date  . None     Family History  Problem Relation Age of Onset  . Seizures Mother   . Seizures Maternal Grandfather    History  Substance Use Topics  . Smoking status: Never Smoker   . Smokeless tobacco: Never Used  . Alcohol Use: No    Review of Systems  Constitutional: Positive for fever and chills.  HENT: Positive for congestion and rhinorrhea. Negative for ear pain and sore throat.   Respiratory: Positive for cough. Negative for shortness of breath.   Gastrointestinal: Negative for nausea, vomiting and diarrhea.      Allergies  Dust mite extract; Mold extract; and Pollen extract  Home Medications   Prior to Admission medications   Medication Sig Start Date End Date Taking? Authorizing Provider  cholecalciferol (VITAMIN D) 1000 UNITS tablet Take 1,000 Units by mouth daily.   Yes Historical Provider, MD  doxycycline (VIBRAMYCIN) 100 MG capsule Take 100 mg by mouth 2 (two) times daily.   Yes Historical Provider, MD  phenytoin (DILANTIN) 100 MG ER capsule Take 3 capsules (300 mg total) by mouth at bedtime. 02/18/14  Yes  York Spanielharles K Willis, MD  ondansetron (ZOFRAN ODT) 4 MG disintegrating tablet Take 1 tablet (4 mg total) by mouth every 8 (eight) hours as needed for nausea. Patient not taking: Reported on 12/29/2014 09/15/13   Garlon HatchetLisa M Sanders, PA-C  phenytoin (DILANTIN) 50 MG tablet Chew 1 tablet (50 mg total) by mouth daily. In the Morning. Patient not taking: Reported on 12/29/2014 02/18/14   York Spanielharles K Willis, MD   BP 127/80 mmHg  Pulse 99  Temp(Src) 100.2 F (37.9 C) (Oral)  Resp 18  Ht 5\' 11"  (1.803 m)  Wt 208 lb (94.348 kg)  BMI 29.02 kg/m2  SpO2 100% Physical Exam  Constitutional: He appears well-developed and well-nourished. No distress.  HENT:  Head: Normocephalic and atraumatic.  Nose: Right sinus exhibits no maxillary sinus tenderness and no frontal sinus tenderness. Left sinus exhibits no maxillary sinus tenderness and no frontal sinus tenderness.  Mouth/Throat: Mucous membranes are normal. No oropharyngeal exudate, posterior oropharyngeal edema or posterior oropharyngeal erythema.  No trismus or uvula deviation.  Eyes: Conjunctivae and EOM are normal. Right eye exhibits no discharge. Left eye exhibits no discharge.  Neck: Normal range of motion. Neck supple.  Cardiovascular: Normal rate, regular rhythm and normal heart sounds.   Pulmonary/Chest: Effort normal and breath sounds normal. No respiratory distress. He has no wheezes. He has no rales.  Abdominal: Soft. Bowel sounds are normal. He exhibits no distension. There is no tenderness.  Lymphadenopathy:    He has cervical adenopathy.  Neurological: He is  alert.  Skin: Skin is warm and dry. He is not diaphoretic.  Nursing note and vitals reviewed.   ED Course  Procedures (including critical care time) Labs Review Labs Reviewed - No data to display  Imaging Review Dg Chest 2 View  12/29/2014   CLINICAL DATA:  Intermittently productive cough and fever for the past week.  EXAM: CHEST  2 VIEW  COMPARISON:  Thoracic spine radiographs dated  09/15/2013.  FINDINGS: Normal sized heart. Clear lungs. Mild diffuse peribronchial thickening. Unremarkable bones.  IMPRESSION: Mild bronchitic changes.   Electronically Signed   By: Beckie Salts M.D.   On: 12/29/2014 12:12     EKG Interpretation None      Meds given in ED:  Medications  dexamethasone (DECADRON) injection 5 mg (not administered)  ibuprofen (ADVIL,MOTRIN) tablet 800 mg (not administered)  albuterol (PROVENTIL HFA;VENTOLIN HFA) 108 (90 BASE) MCG/ACT inhaler 2 puff (2 puffs Inhalation Given 12/29/14 1141)    New Prescriptions   No medications on file      MDM   Final diagnoses:  Bronchitis   Patient presenting with one week of cough productive of thick sputum as well as low-grade fevers. Patient currently taking doxycycline. VSS. Chest x-ray without evidence of pneumonia. Patient likely with bronchitis. Patient to follow-up with PCP for persistent symptoms.  Discussed return precautions with patient. Discussed all results and patient verbalizes understanding and agrees with plan.     Oswaldo Conroy, PA-C 12/29/14 1220  Kristen N Ward, DO 12/29/14 1312

## 2014-12-29 NOTE — Discharge Instructions (Signed)
Return to the emergency room with worsening of symptoms, new symptoms or with symptoms that are concerning , especially fevers, stiff neck, worsening headache, nausea/vomiting, visual changes or slurred speech, chest pain, shortness of breath, cough with thick colored mucous or blood Drink plenty of fluids with electrolytes especially Gatorade. OTC cold medications such as mucinex, nyquil, dayquil are recommended. Chloraseptic for sore throat. Continue take the remainder of your doxycycline. Use your inhaler for cough. Continue to alternate Tylenol and ibuprofen. Read below information and follow recommendations. Acute Bronchitis Bronchitis is inflammation of the airways that extend from the windpipe into the lungs (bronchi). The inflammation often causes mucus to develop. This leads to a cough, which is the most common symptom of bronchitis.  In acute bronchitis, the condition usually develops suddenly and goes away over time, usually in a couple weeks. Smoking, allergies, and asthma can make bronchitis worse. Repeated episodes of bronchitis may cause further lung problems.  CAUSES Acute bronchitis is most often caused by the same virus that causes a cold. The virus can spread from person to person (contagious) through coughing, sneezing, and touching contaminated objects. SIGNS AND SYMPTOMS   Cough.   Fever.   Coughing up mucus.   Body aches.   Chest congestion.   Chills.   Shortness of breath.   Sore throat.  DIAGNOSIS  Acute bronchitis is usually diagnosed through a physical exam. Your health care provider will also ask you questions about your medical history. Tests, such as chest X-rays, are sometimes done to rule out other conditions.  TREATMENT  Acute bronchitis usually goes away in a couple weeks. Oftentimes, no medical treatment is necessary. Medicines are sometimes given for relief of fever or cough. Antibiotic medicines are usually not needed but may be prescribed  in certain situations. In some cases, an inhaler may be recommended to help reduce shortness of breath and control the cough. A cool mist vaporizer may also be used to help thin bronchial secretions and make it easier to clear the chest.  HOME CARE INSTRUCTIONS  Get plenty of rest.   Drink enough fluids to keep your urine clear or pale yellow (unless you have a medical condition that requires fluid restriction). Increasing fluids may help thin your respiratory secretions (sputum) and reduce chest congestion, and it will prevent dehydration.   Take medicines only as directed by your health care provider.  If you were prescribed an antibiotic medicine, finish it all even if you start to feel better.  Avoid smoking and secondhand smoke. Exposure to cigarette smoke or irritating chemicals will make bronchitis worse. If you are a smoker, consider using nicotine gum or skin patches to help control withdrawal symptoms. Quitting smoking will help your lungs heal faster.   Reduce the chances of another bout of acute bronchitis by washing your hands frequently, avoiding people with cold symptoms, and trying not to touch your hands to your mouth, nose, or eyes.   Keep all follow-up visits as directed by your health care provider.  SEEK MEDICAL CARE IF: Your symptoms do not improve after 1 week of treatment.  SEEK IMMEDIATE MEDICAL CARE IF:  You develop an increased fever or chills.   You have chest pain.   You have severe shortness of breath.  You have bloody sputum.   You develop dehydration.  You faint or repeatedly feel like you are going to pass out.  You develop repeated vomiting.  You develop a severe headache. MAKE SURE YOU:   Understand these instructions.  Will watch your condition.  Will get help right away if you are not doing well or get worse. Document Released: 09/30/2004 Document Revised: 01/07/2014 Document Reviewed: 02/13/2013 [facility] Patient Information  2015 [place], [place]. This information is not intended to replace advice given to you by your health care provider. Make sure you discuss any questions you have with your health care provider.

## 2014-12-29 NOTE — ED Notes (Signed)
Pt reports productive cough with green sputum x 1 week and running a fever since Monday. Started on antibiotics on Tuesday with no relief.

## 2014-12-29 NOTE — ED Notes (Signed)
Patient transported to X-ray 

## 2015-03-06 ENCOUNTER — Other Ambulatory Visit: Payer: Self-pay

## 2015-03-06 MED ORDER — PHENYTOIN SODIUM EXTENDED 100 MG PO CAPS: 300.0000 mg | ORAL_CAPSULE | Freq: Every day | ORAL | Status: DC

## 2015-03-06 MED ORDER — PHENYTOIN 50 MG PO CHEW: 50.0000 mg | CHEWABLE_TABLET | Freq: Every day | ORAL | Status: DC

## 2015-04-03 ENCOUNTER — Ambulatory Visit (INDEPENDENT_AMBULATORY_CARE_PROVIDER_SITE_OTHER): Payer: BLUE CROSS/BLUE SHIELD | Admitting: Neurology

## 2015-04-03 ENCOUNTER — Encounter: Payer: Self-pay | Admitting: Neurology

## 2015-04-03 VITALS — BP 118/87 | HR 82 | Ht 71.0 in | Wt 203.0 lb

## 2015-04-03 DIAGNOSIS — R569 Unspecified convulsions: Secondary | ICD-10-CM

## 2015-04-03 DIAGNOSIS — Z5181 Encounter for therapeutic drug level monitoring: Secondary | ICD-10-CM | POA: Diagnosis not present

## 2015-04-03 MED ORDER — PHENYTOIN 50 MG PO CHEW: 50.0000 mg | CHEWABLE_TABLET | Freq: Every day | ORAL | Status: DC

## 2015-04-03 MED ORDER — PHENYTOIN SODIUM EXTENDED 100 MG PO CAPS: 300.0000 mg | ORAL_CAPSULE | Freq: Every day | ORAL | Status: DC

## 2015-04-03 NOTE — Progress Notes (Signed)
    Reason for visit: Seizures  Marc Johnson is an 44 y.o. male  History of present illness:  Marc Johnson is a 44 year old right-handed white male with a history of well-controlled seizures. He is on generic phenytoin, and he tolerates the medication well. He has not had any seizures since last seen. 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 The patient reports no new medical issues that have come up since last seen. He indicates that he is not taking his vitamin D supplementation regularly.  Past Medical History  Diagnosis Date  . Convulsions/seizures 06/11/2013    Past Surgical History  Procedure Laterality Date  . None      Family History  Problem Relation Age of Onset  . Seizures Mother   . Seizures Maternal Grandfather     Social history:  reports that he has never smoked. He has never used smokeless tobacco. He reports that he does not drink alcohol or use illicit drugs.    Allergies  Allergen Reactions  . Dust Mite Extract   . Mold Extract [Trichophyton]   . Pollen Extract   . Amoxicillin-Pot Clavulanate Other (See Comments)    Medications:  Prior to Admission medications   Medication Sig Start Date End Date Taking? Authorizing Provider  phenytoin (DILANTIN) 100 MG ER capsule Take 3 capsules (300 mg total) by mouth at bedtime. 03/06/15  Yes Marc Spaniel, MD  phenytoin (DILANTIN) 50 MG tablet Chew 1 tablet (50 mg total) by mouth daily. In the Morning. 03/06/15  Yes Marc Spaniel, MD  cholecalciferol (VITAMIN D) 1000 UNITS tablet Take 1,000 Units by mouth daily.    Historical Provider, MD    ROS:  Out of a complete 14 system review of symptoms, the patient complains only of the following symptoms, and all other reviewed systems are negative.  Frequent waking Snoring  Blood pressure 118/87, pulse 82, height  (1.803 m), weight 203 lb (92.08 kg).  Physical Exam  General: The patient is alert and cooperative at the  time of the examination.  Skin: No significant peripheral edema is noted.   Neurologic Exam  Mental status: The patient is alert and oriented x 3 at the time of the examination. The patient has apparent normal recent and remote memory, with an apparently normal attention span and concentration ability.   Cranial nerves: Facial symmetry is present. Speech is normal, no aphasia or dysarthria is noted. Extraocular movements are full. Visual fields are full.  Motor: The patient has good strength in all 4 extremities.  Sensory examination: Soft touch sensation is symmetric on the face, arms, and legs.  Coordination: The patient has good finger-nose-finger and heel-to-shin bilaterally.  Gait and station: The patient has a normal gait. Tandem gait is normal. Romberg is negative. No drift is seen.  Reflexes: Deep tendon reflexes are symmetric.   Assessment/Plan:  1. History seizures  The patient is well controlled on the seizures. We will continue the medication for now, a prescription was called into his pharmacy, and we will check blood work today. He will follow-up in one year, sooner if needed.  Marc Palau MD 04/03/2015 8:11 PM  [facility] [address] [place], [place] [address]  Phone [phone] Fax [phone]

## 2015-04-03 NOTE — Patient Instructions (Signed)

## 2015-04-04 LAB — COMPREHENSIVE METABOLIC PANEL
ALT: 26 IU/L (ref 0–44)
AST: 20 IU/L (ref 0–40)
Albumin/Globulin Ratio: 1.4 (ref 1.1–2.5)
Albumin: 4.2 g/dL (ref 3.5–5.5)
Alkaline Phosphatase: 111 IU/L (ref 39–117)
BUN/Creatinine Ratio: 12 (ref 9–20)
BUN: 11 mg/dL (ref 6–24)
Bilirubin Total: 0.2 mg/dL (ref 0.0–1.2)
CALCIUM: 9.4 mg/dL (ref 8.7–10.2)
CHLORIDE: 102 mmol/L (ref 97–108)
CO2: 26 mmol/L (ref 18–29)
CREATININE: 0.9 mg/dL (ref 0.76–1.27)
GFR, EST AFRICAN AMERICAN: 118 mL/min/{1.73_m2} (ref >59)
GFR, EST NON AFRICAN AMERICAN: 102 mL/min/{1.73_m2} (ref >59)
GLUCOSE: 114 mg/dL — AB (ref 65–99)
Globulin, Total: 3 g/dL (ref 1.5–4.5)
POTASSIUM: 4.5 mmol/L (ref 3.5–5.2)
Sodium: 142 mmol/L (ref 134–144)
Total Protein: 7.2 g/dL (ref 6.0–8.5)

## 2015-04-04 LAB — CBC WITH DIFFERENTIAL/PLATELET
BASOS ABS: 0.1 10*3/uL (ref 0.0–0.2)
Basos: 1 %
EOS (ABSOLUTE): 0.2 10*3/uL (ref 0.0–0.4)
Eos: 3 %
HEMOGLOBIN: 15.5 g/dL (ref 12.6–17.7)
Hematocrit: 45 % (ref 37.5–51.0)
IMMATURE GRANS (ABS): 0 10*3/uL (ref 0.0–0.1)
IMMATURE GRANULOCYTES: 0 %
LYMPHS ABS: 1.6 10*3/uL (ref 0.7–3.1)
Lymphs: 25 %
MCH: 30.7 pg (ref 26.6–33.0)
MCHC: 34.4 g/dL (ref 31.5–35.7)
MCV: 89 fL (ref 79–97)
MONOCYTES: 7 %
Monocytes Absolute: 0.5 10*3/uL (ref 0.1–0.9)
NEUTROS PCT: 64 %
Neutrophils Absolute: 4.2 10*3/uL (ref 1.4–7.0)
PLATELETS: 192 10*3/uL (ref 150–379)
RBC: 5.05 x10E6/uL (ref 4.14–5.80)
RDW: 14.5 % (ref 12.3–15.4)
WBC: 6.5 10*3/uL (ref 3.4–10.8)

## 2015-04-04 LAB — PHENYTOIN LEVEL, TOTAL: Phenytoin (Dilantin), Serum: 13 ug/mL (ref 10.0–20.0)

## 2015-09-29 ENCOUNTER — Other Ambulatory Visit: Payer: Self-pay

## 2015-09-29 ENCOUNTER — Telehealth: Payer: Self-pay | Admitting: Neurology

## 2015-09-29 MED ORDER — PHENYTOIN 50 MG PO CHEW: 50.0000 mg | CHEWABLE_TABLET | Freq: Every day | ORAL | Status: DC

## 2015-09-29 MED ORDER — PHENYTOIN SODIUM EXTENDED 100 MG PO CAPS: 300.0000 mg | ORAL_CAPSULE | Freq: Every day | ORAL | Status: DC

## 2015-09-29 NOTE — Telephone Encounter (Signed)
Rx sent 

## 2015-09-29 NOTE — Telephone Encounter (Signed)
Patient called to request refills of phenytoin (DILANTIN) 100 MG ER capsule and phenytoin (DILANTIN) 50 MG tablet

## 2016-02-11 IMAGING — DX DG CHEST 2V
2 series · 2 of 2 positions shown · non-contrast
Comparison: Thoracic spine radiographs dated 09/15/2013.

CLINICAL DATA: Intermittently productive cough and fever for the
past week.

EXAM:
CHEST  2 VIEW

[chest pa]
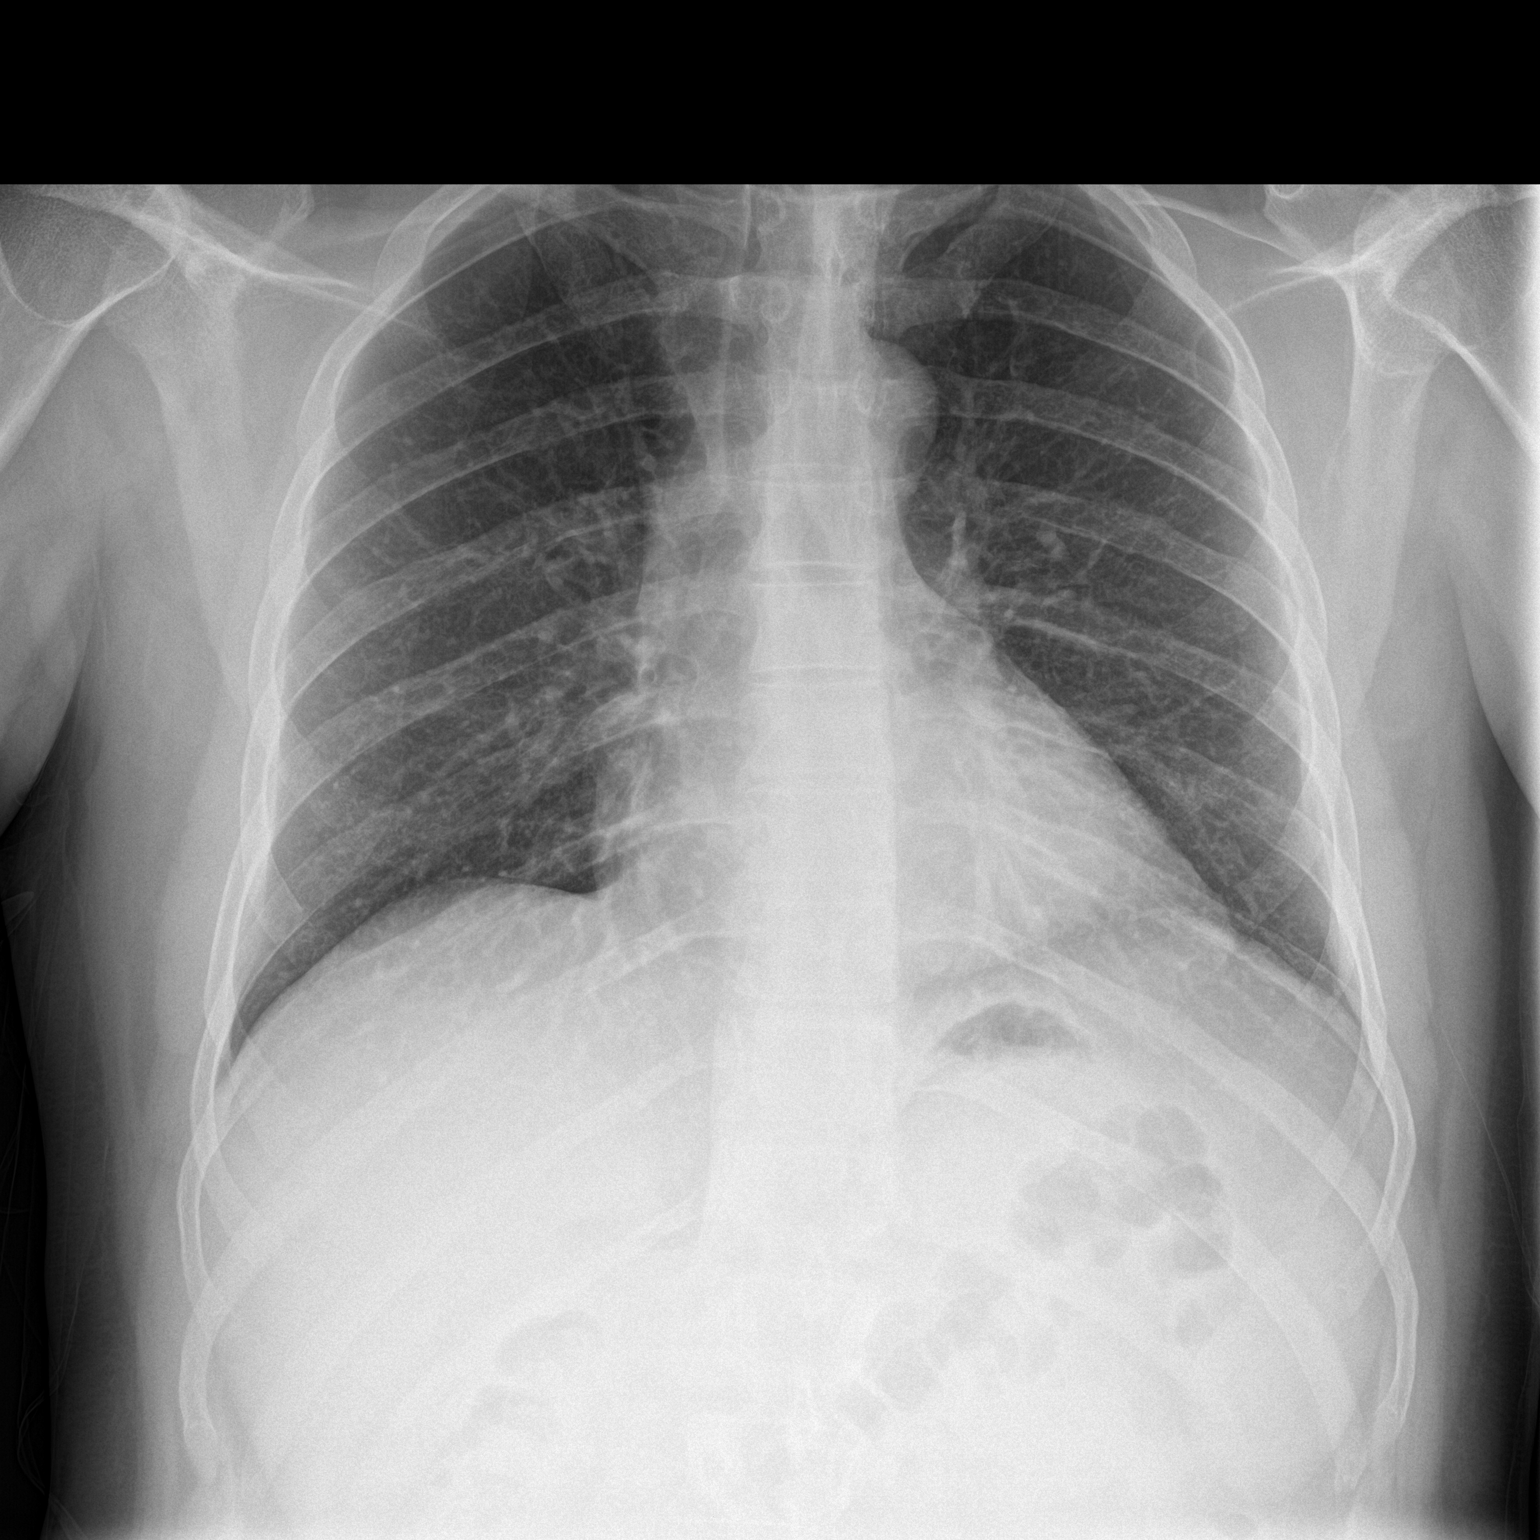

[chest lat]
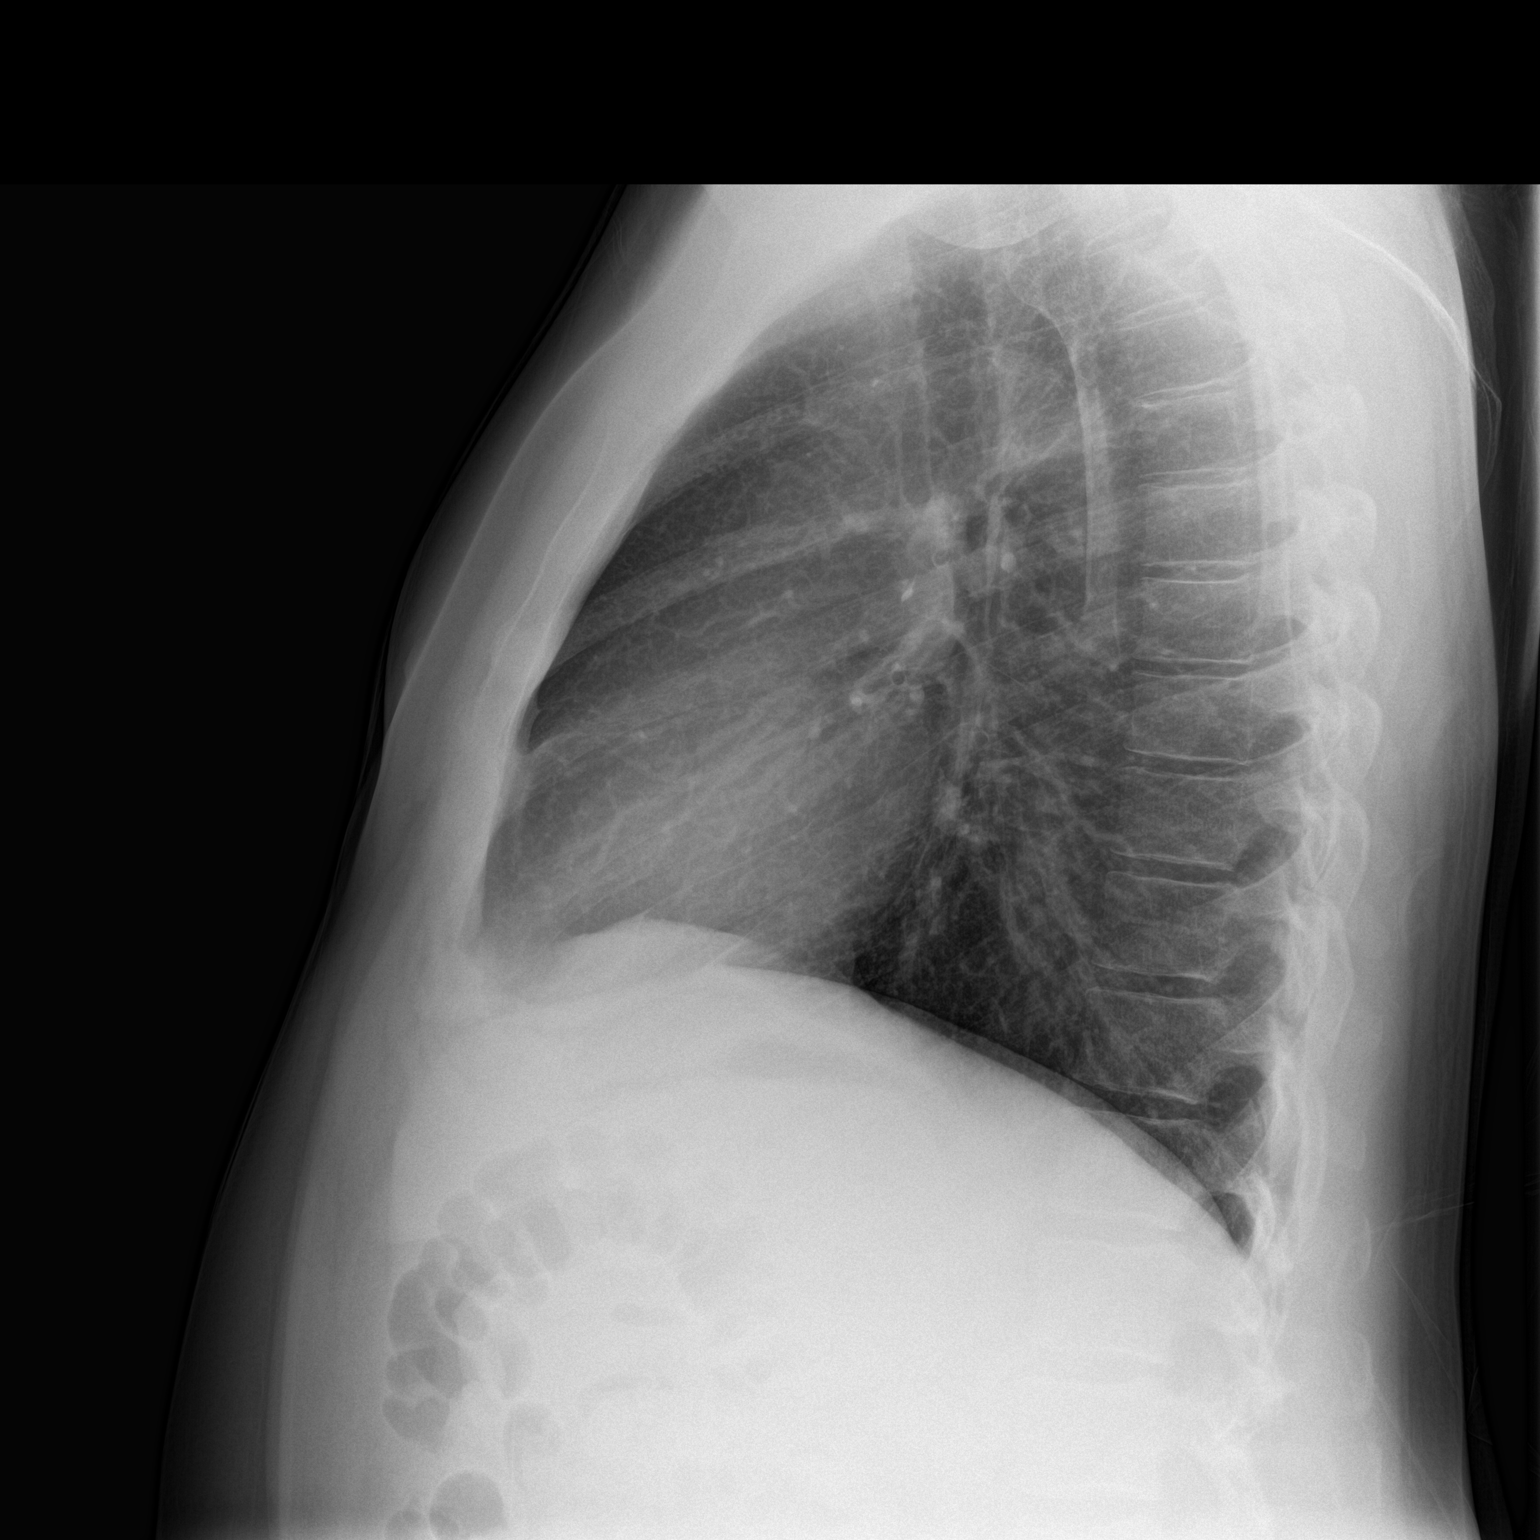

[2 of 2 positions shown; findings below may reference images not displayed]

FINDINGS: Normal sized heart. Clear lungs. Mild diffuse peribronchial
thickening. Unremarkable bones.
IMPRESSION: Mild bronchitic changes.

## 2016-04-01 ENCOUNTER — Ambulatory Visit: Payer: BLUE CROSS/BLUE SHIELD | Admitting: Adult Health

## 2016-04-05 ENCOUNTER — Other Ambulatory Visit: Payer: Self-pay | Admitting: Neurology

## 2016-05-06 ENCOUNTER — Ambulatory Visit (INDEPENDENT_AMBULATORY_CARE_PROVIDER_SITE_OTHER): Payer: 59 | Admitting: Adult Health

## 2016-05-06 ENCOUNTER — Encounter: Payer: Self-pay | Admitting: Adult Health

## 2016-05-06 VITALS — BP 124/84 | HR 78 | Wt 210.8 lb

## 2016-05-06 DIAGNOSIS — Z5181 Encounter for therapeutic drug level monitoring: Secondary | ICD-10-CM

## 2016-05-06 DIAGNOSIS — R569 Unspecified convulsions: Secondary | ICD-10-CM

## 2016-05-06 NOTE — Patient Instructions (Signed)
Continue Dilantin  Blood work today If your symptoms worsen or you develop new symptoms please let us know.   

## 2016-05-06 NOTE — Progress Notes (Signed)
PATIENT: Marc Johnson DOB: [birth year]  REASON FOR VISIT: follow up- seizures HISTORY FROM: patient  HISTORY OF PRESENT ILLNESS: Marc Johnson is a 45 year old male with a history of seizures. He returns today for follow-up. He is currently taking Dilantin and tolerating it well. He denies any additional seizure events. 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 He states that they are contemplating changing his hours. He states last time this happened he had a seizure. He is questioning if we will be willing to write a letter if they decide to change his hours. He denies any new neurological symptoms. He returns today for an evaluation.  HISTORY 04/03/15: Marc Johnson is a 45 year old right-handed white male with a history of well-controlled seizures. He is on generic phenytoin, and he tolerates the medication well. He has not had any seizures since last seen. 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 The patient reports no new medical issues that have come up since last seen. He indicates that he is not taking his vitamin D supplementation regularly.   REVIEW OF SYSTEMS: Out of a complete 14 system review of symptoms, the patient complains only of the following symptoms, and all other reviewed systems are negative.  See history of present illness  ALLERGIES: Allergies  Allergen Reactions  . Dust Mite Extract   . Mold Extract [Trichophyton]   . Pollen Extract   . Amoxicillin-Pot Clavulanate Other (See Comments)    GI intolerance    HOME MEDICATIONS: Outpatient Medications Prior to Visit  Medication Sig Dispense Refill  . cholecalciferol (VITAMIN D) 1000 UNITS tablet Take 1,000 Units by mouth daily.    . phenytoin (DILANTIN) 100 MG ER capsule TAKE THREE CAPSULES BY MOUTH ONCE DAILY AT BEDTIME -  NEEDS  AN  APPOINTMENT 90 capsule 0  . phenytoin (DILANTIN) 50 MG tablet CHEW AND SWALLOW ONE TABLET BY MOUTH ONCE DAILY IN THE  MORNING -  NEEDS  TO  SCHEDULE  APPOINTMENT 30 tablet 0   No facility-administered medications prior to visit.     PAST MEDICAL HISTORY: Past Medical History:  Diagnosis Date  . Convulsions/seizures (HCC) 06/11/2013    PAST SURGICAL HISTORY: Past Surgical History:  Procedure Laterality Date  . none      FAMILY HISTORY: Family History  Problem Relation Age of Onset  . Seizures Mother   . Seizures Maternal Grandfather     SOCIAL HISTORY: Social History   Social History  . Marital status: Married    Spouse name: Marc Johnson  . Number of children: 1  . Years of education: college   Occupational History  .  [employer]   Social History Main Topics  . Smoking status: Never Smoker  . Smokeless tobacco: Never Used  . Alcohol use No  . Drug use: No  . Sexual activity: Not on file   Other Topics Concern  . Not on file   Social History Narrative  . No narrative on file      PHYSICAL EXAM  Vitals:   05/06/16 1526  BP: 124/84  Pulse: 78  Weight: 210 lb 12.8 oz (95.6 kg)   Body mass index is 29.4 kg/m.  Generalized: Well developed, in no acute distress   Neurological examination  Mentation: Alert oriented to time, place, history taking. Follows all commands speech and language fluent Cranial nerve II-XII: Pupils were equal round reactive to light. Extraocular movements were full, visual field  were full on confrontational test. Facial sensation and strength were normal. Uvula tongue midline. Head turning and shoulder shrug  were normal and symmetric. Motor: The motor testing reveals 5 over 5 strength of all 4 extremities. Good symmetric motor tone is noted throughout.  Sensory: Sensory testing is intact to soft touch on all 4 extremities. No evidence of extinction is noted.  Coordination: Cerebellar testing reveals good finger-nose-finger and heel-to-shin bilaterally.  Gait and station: Gait is normal. Tandem gait is normal. Romberg is negative. No drift is  seen.  Reflexes: Deep tendon reflexes are symmetric and normal bilaterally.   DIAGNOSTIC DATA (LABS, IMAGING, TESTING) - I reviewed patient records, labs, notes, testing and imaging myself where available.  Lab Results  Component Value Date   WBC 6.5 04/03/2015   HGB 16.3 09/15/2013   HCT 45.0 04/03/2015   MCV 89 04/03/2015   PLT 192 04/03/2015      Component Value Date/Time   NA 142 04/03/2015 1627   K 4.5 04/03/2015 1627   CL 102 04/03/2015 1627   CO2 26 04/03/2015 1627   GLUCOSE 114 (H) 04/03/2015 1627   GLUCOSE 108 (H) 09/15/2013 0721   BUN 11 04/03/2015 1627   CREATININE 0.90 04/03/2015 1627   CALCIUM 9.4 04/03/2015 1627   PROT 7.2 04/03/2015 1627   ALBUMIN 4.2 04/03/2015 1627   AST 20 04/03/2015 1627   ALT 26 04/03/2015 1627   ALKPHOS 111 04/03/2015 1627   BILITOT 0.2 04/03/2015 1627   GFRNONAA 102 04/03/2015 1627   GFRAA 118 04/03/2015 1627      ASSESSMENT AND PLAN 45 y.o. year old male  has a past medical history of Convulsions/seizures (HCC) (06/11/2013). here with:  1. Seizures  Overall the patient is doing well. He will continue on Dilantin. I will check blood work today. If his symptoms worsen or he develops any new symptoms he should let us know. Follow-up in 1 year or sooner if needed.     Butch PennyMegan Ladarren Steiner, MSN, NP-C 05/06/2016, 3:33 PM [facility] [address] [place], [place] [address] [phone]

## 2016-05-06 NOTE — Progress Notes (Signed)
I have read the note, and I agree with the clinical assessment and plan.  Althea Backs KEITH   

## 2016-05-07 LAB — CBC WITH DIFFERENTIAL/PLATELET
BASOS ABS: 0.1 10*3/uL (ref 0.0–0.2)
Basos: 1 %
EOS (ABSOLUTE): 0.2 10*3/uL (ref 0.0–0.4)
Eos: 3 %
HEMOGLOBIN: 15.5 g/dL (ref 12.6–17.7)
Hematocrit: 44.7 % (ref 37.5–51.0)
Immature Grans (Abs): 0 10*3/uL (ref 0.0–0.1)
Immature Granulocytes: 0 %
LYMPHS ABS: 1.8 10*3/uL (ref 0.7–3.1)
Lymphs: 26 %
MCH: 30.6 pg (ref 26.6–33.0)
MCHC: 34.7 g/dL (ref 31.5–35.7)
MCV: 88 fL (ref 79–97)
MONOCYTES: 10 %
Monocytes Absolute: 0.7 10*3/uL (ref 0.1–0.9)
Neutrophils Absolute: 4.4 10*3/uL (ref 1.4–7.0)
Neutrophils: 60 %
Platelets: 177 10*3/uL (ref 150–379)
RBC: 5.06 x10E6/uL (ref 4.14–5.80)
RDW: 14.1 % (ref 12.3–15.4)
WBC: 7.1 10*3/uL (ref 3.4–10.8)

## 2016-05-07 LAB — COMPREHENSIVE METABOLIC PANEL
A/G RATIO: 1.5 (ref 1.2–2.2)
ALBUMIN: 4.2 g/dL (ref 3.5–5.5)
ALT: 36 IU/L (ref 0–44)
AST: 24 IU/L (ref 0–40)
Alkaline Phosphatase: 115 IU/L (ref 39–117)
BUN / CREAT RATIO: 12 (ref 9–20)
BUN: 12 mg/dL (ref 6–24)
CHLORIDE: 106 mmol/L (ref 96–106)
CO2: 26 mmol/L (ref 18–29)
Calcium: 9.3 mg/dL (ref 8.7–10.2)
Creatinine, Ser: 0.98 mg/dL (ref 0.76–1.27)
GFR calc non Af Amer: 91 mL/min/{1.73_m2} (ref >59)
GFR, EST AFRICAN AMERICAN: 106 mL/min/{1.73_m2} (ref >59)
Globulin, Total: 2.8 g/dL (ref 1.5–4.5)
Glucose: 99 mg/dL (ref 65–99)
POTASSIUM: 4.9 mmol/L (ref 3.5–5.2)
Sodium: 143 mmol/L (ref 134–144)
TOTAL PROTEIN: 7 g/dL (ref 6.0–8.5)

## 2016-05-07 LAB — PHENYTOIN LEVEL, TOTAL: PHENYTOIN (DILANTIN), SERUM: 15.4 ug/mL (ref 10.0–20.0)

## 2016-05-08 ENCOUNTER — Other Ambulatory Visit: Payer: Self-pay | Admitting: Neurology

## 2016-05-11 ENCOUNTER — Telehealth: Payer: Self-pay | Admitting: Neurology

## 2016-05-11 ENCOUNTER — Telehealth: Payer: Self-pay | Admitting: *Deleted

## 2016-05-11 MED ORDER — PHENYTOIN 50 MG PO CHEW: 50.0000 mg | CHEWABLE_TABLET | Freq: Every day | ORAL | 11 refills | Status: DC

## 2016-05-11 MED ORDER — PHENYTOIN SODIUM EXTENDED 100 MG PO CAPS: 300.0000 mg | ORAL_CAPSULE | Freq: Every day | ORAL | 11 refills | Status: DC

## 2016-05-11 NOTE — Telephone Encounter (Signed)
Prescription for dilantin e-prescribed.

## 2016-05-11 NOTE — Telephone Encounter (Signed)
Received message from pharmacy on 05/08/16, that faxed Rx was not received. Please look into this. Please refax if needed.

## 2016-05-11 NOTE — Telephone Encounter (Signed)
-----   Message from Butch PennyMegan Millikan, NP sent at 05/11/2016  8:51 AM EDT ----- Lab work unremarkable. Please call patient

## 2016-05-11 NOTE — Telephone Encounter (Signed)
I have spoken with wife Efraim KaufmannMelissa, and per MM, advised that labs done in our office were normal, including  Dilantin level.  She verbalized understanding of same/fim

## 2016-05-11 NOTE — Addendum Note (Signed)
Addended by: Enedina FinnerMILLIKAN, Terez Montee P on: 05/11/2016 02:49 PM   Modules accepted: Orders

## 2016-05-11 NOTE — Telephone Encounter (Signed)
-----   Message from Megan Millikan, NP sent at 05/11/2016  8:51 AM EDT ----- Lab work unremarkable. Please call patient 

## 2017-02-14 ENCOUNTER — Telehealth: Payer: Self-pay | Admitting: Adult Health

## 2017-02-14 NOTE — Telephone Encounter (Signed)
Called and spoke with wife. NO DPR on file. Advised I cannot release any information. Pt will have to call.  She states he's at home sleeping and not to call home number. She will have him call our office back.

## 2017-02-14 NOTE — Telephone Encounter (Signed)
Ok. Just advise patient that when manufacturers change there is a slight risk that he did have a seizure event.

## 2017-02-14 NOTE — Telephone Encounter (Signed)
Huyen with pharmacy called in reference to patients phenytoin (DILANTIN) 50 MG tablet.  Manufacuturers have been switched from [employer] to [employer]  And will need authorization from our office that this is okay.  Please call [phone]

## 2017-02-15 NOTE — Telephone Encounter (Signed)
Called patient back. Relayed message per MM,NP that there is a small risk of seizure event with changing manufacturers. Patient verbalized he was uncomfortable with doing this. I recommended he contact pharmacy to see where he could possibly get medication with original manufacturer.  He stated he will switch manufacturer.   He had some questions about billing. I transferred call to Loralie ChampagneAngela Atkins in billing.

## 2017-02-15 NOTE — Telephone Encounter (Signed)
Pt returned your call and is asking to be called back around 3pm today so he can speak with you on matter that you called him about

## 2017-02-15 NOTE — Telephone Encounter (Signed)
Called and spoke with Tanyifor at pt pharmacy. He looked pt up. Advised it looked like they were able to give pt refill with old manufacturer yesterday. They will call in the future if manufacturer changes. Unsure of why we were told they changed yesterday. Nothing further needed at this time. He will call pt and verify he received correct rx with old manufacturer and compare NDC's.

## 2017-02-15 NOTE — Telephone Encounter (Signed)
noted 

## 2017-03-14 ENCOUNTER — Telehealth: Payer: Self-pay | Admitting: Neurology

## 2017-03-14 NOTE — Telephone Encounter (Signed)
The patient is on generic Dilantin, has done well, okay to change generic manufactures.

## 2017-03-14 NOTE — Telephone Encounter (Signed)
Patient called office reference to requesting a call back for clarification with the changes with pharmacy.  Please call [phone]

## 2017-03-14 NOTE — Telephone Encounter (Signed)
[facility] calling to see if ok to change manufacturer for phenytoin (DILANTIN) 50 MG tablet.

## 2017-03-14 NOTE — Telephone Encounter (Signed)
Returned call to patient - left message letting him know we received a [facility] from [facility] a generic drug change.  Informed him that Dr. Anne HahnWillis authorized the change.  The pharmacy has also been notified.

## 2017-03-15 ENCOUNTER — Other Ambulatory Visit: Payer: Self-pay | Admitting: *Deleted

## 2017-03-15 MED ORDER — PHENYTOIN 50 MG PO CHEW: 50.0000 mg | CHEWABLE_TABLET | Freq: Every day | ORAL | 1 refills | Status: DC

## 2017-05-10 ENCOUNTER — Ambulatory Visit (INDEPENDENT_AMBULATORY_CARE_PROVIDER_SITE_OTHER): Payer: Managed Care, Other (non HMO) | Admitting: Neurology

## 2017-05-10 ENCOUNTER — Encounter: Payer: Self-pay | Admitting: Neurology

## 2017-05-10 VITALS — BP 120/78 | HR 88 | Ht 71.0 in | Wt 210.0 lb

## 2017-05-10 DIAGNOSIS — Z5181 Encounter for therapeutic drug level monitoring: Secondary | ICD-10-CM | POA: Diagnosis not present

## 2017-05-10 DIAGNOSIS — R569 Unspecified convulsions: Secondary | ICD-10-CM

## 2017-05-10 MED ORDER — PHENYTOIN SODIUM EXTENDED 100 MG PO CAPS: 300.0000 mg | ORAL_CAPSULE | Freq: Every day | ORAL | 11 refills | Status: DC

## 2017-05-10 MED ORDER — PHENYTOIN 50 MG PO CHEW: 50.0000 mg | CHEWABLE_TABLET | Freq: Every day | ORAL | 11 refills | Status: DC

## 2017-05-10 NOTE — Progress Notes (Signed)
    Reason for visit: Seizures  Marc Johnson is an 46 y.o. male  History of present illness:  Marc Johnson is a 46 year old right-handed white male with a history of seizures treated with Dilantin. He has done well on this medication, he had not had any seizures since last seen. He is on 350 mg daily and tolerates this fairly well, he has had a recent change in generic manufactures of his phenytoin, he is somewhat more sleepy on this medication. He denies any problems with balance or falls. He operates a Librarian, academicmotor vehicle without difficulty. He returns to the office today for an evaluation. He is currently working.  Past Medical History:  Diagnosis Date  . Convulsions/seizures (HCC) 06/11/2013    Past Surgical History:  Procedure Laterality Date  . none      Family History  Problem Relation Age of Onset  . Seizures Mother   . Seizures Maternal Grandfather     Social history:  reports that he has never smoked. He has never used smokeless tobacco. He reports that he does not drink alcohol or use drugs.    Allergies  Allergen Reactions  . Dust Mite Extract   . Mold Extract [Trichophyton]   . Pollen Extract   . Amoxicillin-Pot Clavulanate Other (See Comments)    GI intolerance    Medications:  Prior to Admission medications   Medication Sig Start Date End Date Taking? Authorizing Provider  cholecalciferol (VITAMIN D) 1000 UNITS tablet Take 1,000 Units by mouth daily.   Yes [provider]  phenytoin (DILANTIN) 100 MG ER capsule Take 3 capsules (300 mg total) by mouth daily. 05/11/16  Yes Butch PennyMillikan, Megan, NP  phenytoin (DILANTIN) 50 MG tablet Chew 1 tablet (50 mg total) by mouth daily. 03/15/17  Yes York SpanielWillis, Charles K, MD    ROS:  Out of a complete 14 system review of symptoms, the patient complains only of the following symptoms, and all other reviewed systems are negative.  Fatigue  Blood pressure 120/78, pulse 88, height 5\' 11"  (1.803 m), weight 210 lb (95.3  kg).  Physical Exam  General: The patient is alert and cooperative at the time of the examination.  Skin: No significant peripheral edema is noted.   Neurologic Exam  Mental status: The patient is alert and oriented x 3 at the time of the examination. The patient has apparent normal recent and remote memory, with an apparently normal attention span and concentration ability.   Cranial nerves: Facial symmetry is present. Speech is normal, no aphasia or dysarthria is noted. Extraocular movements are full. Visual fields are full.  Motor: The patient has good strength in all 4 extremities.  Sensory examination: Soft touch sensation is symmetric on the face, arms, and legs.  Coordination: The patient has good finger-nose-finger and heel-to-shin bilaterally.  Gait and station: The patient has a normal gait. Tandem gait is normal. Romberg is negative. No drift is seen.  Reflexes: Deep tendon reflexes are symmetric.   Assessment/Plan:  1. History seizures  The patient is doing well on Dilantin, we will check blood work today, a prescription was given for the 100 mg capsules and for the 50 mg tablets of Dilantin. He will follow-up in one year.  Marlan Palau. Keith Willis MD 05/10/2017 3:34 PM  [facility] [address] [place], [place] [id number]  Phone [phone] Fax [phone]

## 2017-05-11 LAB — CBC WITH DIFFERENTIAL/PLATELET
BASOS ABS: 0.1 10*3/uL (ref 0.0–0.2)
Basos: 1 %
EOS (ABSOLUTE): 0.2 10*3/uL (ref 0.0–0.4)
EOS: 2 %
HEMATOCRIT: 46.4 % (ref 37.5–51.0)
HEMOGLOBIN: 15.6 g/dL (ref 13.0–17.7)
Immature Grans (Abs): 0 10*3/uL (ref 0.0–0.1)
Immature Granulocytes: 0 %
LYMPHS: 22 %
Lymphocytes Absolute: 2 10*3/uL (ref 0.7–3.1)
MCH: 30.8 pg (ref 26.6–33.0)
MCHC: 33.6 g/dL (ref 31.5–35.7)
MCV: 92 fL (ref 79–97)
MONOCYTES: 9 %
Monocytes Absolute: 0.8 10*3/uL (ref 0.1–0.9)
Neutrophils Absolute: 5.9 10*3/uL (ref 1.4–7.0)
Neutrophils: 66 %
PLATELETS: 198 10*3/uL (ref 150–379)
RBC: 5.07 x10E6/uL (ref 4.14–5.80)
RDW: 14.2 % (ref 12.3–15.4)
WBC: 8.9 10*3/uL (ref 3.4–10.8)

## 2017-05-11 LAB — COMPREHENSIVE METABOLIC PANEL
A/G RATIO: 1.5 (ref 1.2–2.2)
ALT: 33 IU/L (ref 0–44)
AST: 24 IU/L (ref 0–40)
Albumin: 4.3 g/dL (ref 3.5–5.5)
Alkaline Phosphatase: 108 IU/L (ref 39–117)
BUN/Creatinine Ratio: 16 (ref 9–20)
BUN: 14 mg/dL (ref 6–24)
Bilirubin Total: 0.2 mg/dL (ref 0.0–1.2)
CALCIUM: 9.3 mg/dL (ref 8.7–10.2)
CO2: 26 mmol/L (ref 20–29)
Chloride: 104 mmol/L (ref 96–106)
Creatinine, Ser: 0.87 mg/dL (ref 0.76–1.27)
GFR, EST AFRICAN AMERICAN: 118 mL/min/{1.73_m2} (ref >59)
GFR, EST NON AFRICAN AMERICAN: 102 mL/min/{1.73_m2} (ref >59)
Globulin, Total: 2.9 g/dL (ref 1.5–4.5)
Glucose: 80 mg/dL (ref 65–99)
POTASSIUM: 4.5 mmol/L (ref 3.5–5.2)
Sodium: 145 mmol/L — ABNORMAL HIGH (ref 134–144)
TOTAL PROTEIN: 7.2 g/dL (ref 6.0–8.5)

## 2017-05-11 LAB — PHENYTOIN LEVEL, TOTAL: Phenytoin (Dilantin), Serum: 12.3 ug/mL (ref 10.0–20.0)

## 2018-05-08 ENCOUNTER — Other Ambulatory Visit: Payer: Self-pay | Admitting: Neurology

## 2018-05-12 ENCOUNTER — Ambulatory Visit: Payer: Managed Care, Other (non HMO) | Admitting: Nurse Practitioner

## 2018-06-03 ENCOUNTER — Other Ambulatory Visit: Payer: Self-pay | Admitting: Neurology

## 2018-06-11 ENCOUNTER — Other Ambulatory Visit: Payer: Self-pay | Admitting: Neurology

## 2018-06-12 ENCOUNTER — Telehealth: Payer: Self-pay | Admitting: Neurology

## 2018-06-12 MED ORDER — PHENYTOIN 50 MG PO CHEW: CHEWABLE_TABLET | ORAL | 2 refills | Status: DC

## 2018-06-12 MED ORDER — PHENYTOIN SODIUM EXTENDED 100 MG PO CAPS: 300.0000 mg | ORAL_CAPSULE | Freq: Every day | ORAL | 2 refills | Status: DC

## 2018-06-12 NOTE — Telephone Encounter (Signed)
Pt states as of today he will be out of his PHENYTOIN INFATABS 50 MG tablet.  Pt asking for refill on the phenytoin (DILANTIN) 100 MG ER capsule also.  Pt was offered an appointment for Thurs of this week(to be seen sooner) he declined needing to have put in for day off 7days before needing time off work .  Pt is asking these medications be called into [facility] - [place], [place] - [address]

## 2018-06-12 NOTE — Telephone Encounter (Signed)
Patient rescheduled his appt due to work.  His new appt is with Eber Jones, NP on 09/04/18.  He is aware this appt must be kept to continue refills.

## 2018-06-12 NOTE — Addendum Note (Signed)
Addended by: Lindell Spar C on: 06/12/2018 12:02 PM   Modules accepted: Orders

## 2018-09-03 NOTE — Progress Notes (Signed)
[facility]  PATIENT: Marc Johnson DOB: 1971-01-21   REASON FOR VISIT: Follow-up for seizure disorder HISTORY FROM: Patient    HISTORY OF PRESENT ILLNESS:UPDATE 12/30/2019CM Marc Johnson, 47 year old male returns for follow-up with history of seizure disorder.  He has not had seizures in several years.  He is currently on Dilantin 350 mg daily and tolerates this fairly well.  He denies any falls or balance.  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  He returns for reevaluation   05/10/17 KWMr. Johnson is a 47 year old right-handed white male with a history of seizures treated with Dilantin. He has done well on this medication, he had not had any seizures since last seen. He is on 350 mg daily and tolerates this fairly well, he has had a recent change in generic manufactures of his phenytoin, he is somewhat more sleepy on this medication. He denies any problems with balance or falls. He operates a Librarian, academic without difficulty. He returns to the office today for an evaluation. He is currently working  REVIEW OF SYSTEMS: Full 14 system review of systems performed and notable only for those listed, all others are neg:  Constitutional: neg  Cardiovascular: neg Ear/Nose/Throat: neg  Skin: neg Eyes: neg Respiratory: neg Gastroitestinal: neg  Hematology/Lymphatic: neg  Endocrine: neg Musculoskeletal:neg Allergy/Immunology: neg Neurological: History of seizure disorder Psychiatric: neg Sleep : neg   ALLERGIES: Allergies  Allergen Reactions  . Dust Mite Extract   . Mold Extract [Trichophyton]   . Pollen Extract   . Amoxicillin-Pot Clavulanate Other (See Comments)    GI intolerance    HOME MEDICATIONS: Outpatient Medications Prior to Visit  Medication Sig Dispense Refill  . cholecalciferol (VITAMIN D) 1000 UNITS tablet Take 1,000 Units by mouth daily.    . phenytoin (DILANTIN) 100 MG ER capsule Take 3  capsules (300 mg total) by mouth daily. 90 capsule 2  . phenytoin (PHENYTOIN INFATABS) 50 MG tablet TAKE 1 TABLET BY MOUTH ONCE DAILY NEED  OFFICE  VISIT  FOR  FURTHER  REFILLS 30 tablet 2   No facility-administered medications prior to visit.     PAST MEDICAL HISTORY: Past Medical History:  Diagnosis Date  . Convulsions/seizures (HCC) 06/11/2013    PAST SURGICAL HISTORY: Past Surgical History:  Procedure Laterality Date  . none      FAMILY HISTORY: Family History  Problem Relation Age of Onset  . Seizures Mother   . Seizures Maternal Grandfather     SOCIAL HISTORY: Social History   Socioeconomic History  . Marital status: Married    Spouse name: Marc Johnson  . Number of children: 1  . Years of education: college  . Highest education level: Not on file  Occupational History    Employer: [employer]  Social Needs  . Financial resource strain: Not on file  . Food insecurity:    Worry: Not on file    Inability: Not on file  . Transportation needs:    Medical: Not on file    Non-medical: Not on file  Tobacco Use  . Smoking status: Never Smoker  . Smokeless tobacco: Never Used  Substance and Sexual Activity  . Alcohol use: No  . Drug use: No  . Sexual activity: Not on file  Lifestyle  . Physical activity:    Days per week: Not on file    Minutes per session: Not on file  . Stress: Not on file  Relationships  . Social connections:    Talks on phone: Not on file    Gets together: Not on file    Attends religious service: Not on file    Active member of club or organization: Not on file    Attends meetings of clubs or organizations: Not on file    Relationship status: Not on file  . Intimate partner violence:    Fear of current or ex partner: Not on file    Emotionally abused: Not on file    Physically abused: Not on file    Forced sexual activity: Not on file  Other Topics Concern  . Not on file  Social History Narrative  . Not on file      PHYSICAL EXAM  Vitals:   09/04/18 1515  BP: 123/82  Pulse: 82  Weight: 220 lb 6.4 oz (100 kg)  Height: 5\' 11"  (1.803 m)   Body mass index is 30.74 kg/m.  Generalized: Well developed, obese male in no acute distress  Head: normocephalic and atraumatic,. Oropharynx benign  Neck: Supple,  Musculoskeletal: No deformity   Neurological examination   Mentation: Alert oriented to time, place, history taking. Attention span and concentration appropriate. Recent and remote memory intact.  Follows all commands speech and language fluent.   Cranial nerve II-XII: Pupils were equal round reactive to light extraocular movements were full, visual field were full on confrontational test. Facial sensation and strength were normal. hearing was intact to finger rubbing bilaterally. Uvula tongue midline. head turning and shoulder shrug were normal and symmetric.Tongue protrusion into cheek strength was normal. Motor: normal bulk and tone, full strength in the BUE, BLE, Sensory: normal and symmetric to light touch, on the face arms and legs Coordination: finger-nose-finger, heel-to-shin bilaterally, no dysmetria Reflexes: Symmetric upper and lower plantar responses were flexor bilaterally. Gait and Station: Rising up from seated position without assistance, normal stance,  moderate stride, good arm swing, smooth turning, able to perform tiptoe, and heel walking without difficulty. Tandem gait is steady  DIAGNOSTIC DATA (LABS, IMAGING, TESTING) - I reviewed patient records, labs, notes, testing and imaging myself where available.  Lab Results  Component Value Date   WBC 8.9 05/10/2017   HGB 15.6 05/10/2017   HCT 46.4 05/10/2017   MCV 92 05/10/2017   PLT 198 05/10/2017      Component Value Date/Time   NA 145 (H) 05/10/2017 1549   K 4.5 05/10/2017 1549   CL 104 05/10/2017 1549   CO2 26 05/10/2017 1549   GLUCOSE 80 05/10/2017 1549   GLUCOSE 108 (H) 09/15/2013 0721   BUN 14 05/10/2017  1549   CREATININE 0.87 05/10/2017 1549   CALCIUM 9.3 05/10/2017 1549   PROT 7.2 05/10/2017 1549   ALBUMIN 4.3 05/10/2017 1549   AST 24 05/10/2017 1549   ALT 33 05/10/2017 1549   ALKPHOS 108 05/10/2017 1549   BILITOT 0.2 05/10/2017 1549   GFRNONAA 102 05/10/2017 1549   GFRAA 118 05/10/2017 1549    ASSESSMENT AND PLAN  47 y.o. year old male  has a past medical history of Convulsions/seizures (HCC) (06/11/2013). here to follow-up for his seizure disorder.  No seizures in several years.  He is currently on 350 mg of Dilantin daily.   We will obtain labs today, CBC and CMP to monitor for adverse effects of Dilantin  Dilantin level to monitor for therapeutic range  continue Dilantin  at current dose for now, will refill when labs are back Call for seizure activity  Follow-up yearly and as needed Dahna Hattabaugh Carolyn Deanza Upperman, [facility] LtdGNP, [facility], APRN  [facility] [address], [address] [place], [place] [address] [phone]

## 2018-09-04 ENCOUNTER — Telehealth: Payer: Self-pay | Admitting: Nurse Practitioner

## 2018-09-04 ENCOUNTER — Encounter: Payer: Self-pay | Admitting: Nurse Practitioner

## 2018-09-04 ENCOUNTER — Ambulatory Visit (INDEPENDENT_AMBULATORY_CARE_PROVIDER_SITE_OTHER): Payer: Managed Care, Other (non HMO) | Admitting: Nurse Practitioner

## 2018-09-04 VITALS — BP 123/82 | HR 82 | Ht 71.0 in | Wt 220.4 lb

## 2018-09-04 DIAGNOSIS — R569 Unspecified convulsions: Secondary | ICD-10-CM

## 2018-09-04 DIAGNOSIS — Z5181 Encounter for therapeutic drug level monitoring: Secondary | ICD-10-CM | POA: Diagnosis not present

## 2018-09-04 NOTE — Telephone Encounter (Signed)
I relayed message to pt and will have lab results this Thursday and be able to refill the as well.  He verbalized understanding.  He had enough until then.

## 2018-09-04 NOTE — Telephone Encounter (Signed)
Please let patient know that he should have enough Dilantin to last until we get his labs back on Thursday according to Dr. Clarisa KindredWillis's refill.  We will refill at that time

## 2018-09-04 NOTE — Telephone Encounter (Signed)
Patient has requested for me to send RN or NP a message to check on his phenytoin refills. He states [facility] always requests for him to call in advance in order for them to have his medication. [facility] will state that they need to fax us in order to have medication ready for patient.

## 2018-09-04 NOTE — Progress Notes (Signed)
I have read the note, and I agree with the clinical assessment and plan.  Anzlee Hinesley K Aditi Rovira   

## 2018-09-04 NOTE — Patient Instructions (Signed)
We will obtain labs today, CBC and CMP and Dilantin level Continue Dilantin  at current dose for now, will refill when labs are back Call for seizure activity  Follow-up yearly and as needed

## 2018-09-05 LAB — COMPREHENSIVE METABOLIC PANEL
A/G RATIO: 1.5 (ref 1.2–2.2)
ALT: 36 IU/L (ref 0–44)
AST: 22 IU/L (ref 0–40)
Albumin: 4.1 g/dL (ref 3.5–5.5)
Alkaline Phosphatase: 101 IU/L (ref 39–117)
BUN/Creatinine Ratio: 14 (ref 9–20)
BUN: 14 mg/dL (ref 6–24)
Bilirubin Total: 0.2 mg/dL (ref 0.0–1.2)
CALCIUM: 9.4 mg/dL (ref 8.7–10.2)
CO2: 23 mmol/L (ref 20–29)
Chloride: 109 mmol/L — ABNORMAL HIGH (ref 96–106)
Creatinine, Ser: 0.98 mg/dL (ref 0.76–1.27)
GFR calc Af Amer: 104 mL/min/{1.73_m2} (ref >59)
GFR, EST NON AFRICAN AMERICAN: 90 mL/min/{1.73_m2} (ref >59)
Globulin, Total: 2.7 g/dL (ref 1.5–4.5)
Glucose: 88 mg/dL (ref 65–99)
POTASSIUM: 4.3 mmol/L (ref 3.5–5.2)
Sodium: 146 mmol/L — ABNORMAL HIGH (ref 134–144)
Total Protein: 6.8 g/dL (ref 6.0–8.5)

## 2018-09-05 LAB — CBC WITH DIFFERENTIAL/PLATELET
Basophils Absolute: 0.1 10*3/uL (ref 0.0–0.2)
Basos: 1 %
EOS (ABSOLUTE): 0.2 10*3/uL (ref 0.0–0.4)
Eos: 3 %
Hematocrit: 46.1 % (ref 37.5–51.0)
Hemoglobin: 16 g/dL (ref 13.0–17.7)
Immature Grans (Abs): 0 10*3/uL (ref 0.0–0.1)
Immature Granulocytes: 0 %
Lymphocytes Absolute: 1.9 10*3/uL (ref 0.7–3.1)
Lymphs: 24 %
MCH: 31.2 pg (ref 26.6–33.0)
MCHC: 34.7 g/dL (ref 31.5–35.7)
MCV: 90 fL (ref 79–97)
Monocytes Absolute: 0.8 10*3/uL (ref 0.1–0.9)
Monocytes: 10 %
Neutrophils Absolute: 4.9 10*3/uL (ref 1.4–7.0)
Neutrophils: 62 %
Platelets: 179 10*3/uL (ref 150–450)
RBC: 5.13 x10E6/uL (ref 4.14–5.80)
RDW: 12.7 % (ref 12.3–15.4)
WBC: 7.8 10*3/uL (ref 3.4–10.8)

## 2018-09-05 LAB — PHENYTOIN LEVEL, TOTAL: Phenytoin (Dilantin), Serum: 12 ug/mL (ref 10.0–20.0)

## 2018-09-07 ENCOUNTER — Other Ambulatory Visit: Payer: Self-pay | Admitting: Nurse Practitioner

## 2018-09-07 ENCOUNTER — Telehealth: Payer: Self-pay | Admitting: *Deleted

## 2018-09-07 MED ORDER — PHENYTOIN SODIUM EXTENDED 100 MG PO CAPS: 300.0000 mg | ORAL_CAPSULE | Freq: Every day | ORAL | 3 refills | Status: DC

## 2018-09-07 MED ORDER — PHENYTOIN 50 MG PO CHEW: CHEWABLE_TABLET | ORAL | 3 refills | Status: DC

## 2018-09-07 NOTE — Telephone Encounter (Signed)
LMVM for pt that labs stable and dilantin has been refilled.  He is to call back if questions.

## 2018-09-07 NOTE — Telephone Encounter (Signed)
-----   Message from Nilda RiggsNancy Carolyn Martin, NP sent at 09/07/2018  8:15 AM EST ----- Labs are stable please call the patient Dilantin refilled

## 2018-09-20 ENCOUNTER — Telehealth: Payer: Self-pay | Admitting: *Deleted

## 2018-09-20 NOTE — Telephone Encounter (Signed)
LVM unable to reach pt. R/c form from [employer] need payment.

## 2018-09-25 NOTE — Telephone Encounter (Signed)
Will try to reach patient today after 3:15 pm per his request re: NP is not certain the job accomodation request is applicable for her to complete. Need more information from the patient.

## 2018-09-25 NOTE — Telephone Encounter (Signed)
Forms reviewed, completed and signed by NP. Sent to medical records for payment receipt and processing.

## 2018-09-25 NOTE — Telephone Encounter (Addendum)
Called mobile #, LVM requesting call back. Called home phone and spoke with patient to discuss job accomodation form. He stated the company is now owned by [employer], and they are changing his work hours and break/lunch hours almost daily for the past 1-2 weeks. 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  He said that he must have regular work hours, break and lunch times in order to take medications on regular schedule to help prevent seizure activity. I advised  Him the papers will be completed, and when Medical records calls him to let him know they are ready to be processed he must pay fee. He verbalized understanding, appreciation. Forms completed, given to NP to review.

## 2018-10-18 ENCOUNTER — Telehealth: Payer: Self-pay | Admitting: *Deleted

## 2018-10-18 NOTE — Telephone Encounter (Signed)
I re faxed pt [employer] form on 10/18/18

## 2018-10-19 DIAGNOSIS — Z0289 Encounter for other administrative examinations: Secondary | ICD-10-CM

## 2019-08-16 ENCOUNTER — Telehealth: Payer: Self-pay | Admitting: Neurology

## 2019-08-16 MED ORDER — PHENYTOIN SODIUM EXTENDED 100 MG PO CAPS: 300.0000 mg | ORAL_CAPSULE | Freq: Every day | ORAL | 0 refills | Status: DC

## 2019-08-16 MED ORDER — PHENYTOIN 50 MG PO CHEW: CHEWABLE_TABLET | ORAL | 3 refills | Status: DC

## 2019-08-16 NOTE — Telephone Encounter (Signed)
Pt called stating that he is needing a refill on his phenytoin (PHENYTOIN INFATABS) 50 MG tablet and his phenytoin (DILANTIN) 100 MG ER capsule He would also like to know if he is having to keep his appt that he has scheduled since he has been perfectly fine and with no episodes. Please advise and pt states it is fine to LVM.

## 2019-08-17 NOTE — Telephone Encounter (Signed)
Late entry- on 08/16/19 I submitted the refills for the pt's medications. I sent him a my chart message stating he would need to keep his scheduled appointment to keep receiving refills. I also put a note to the pharmacy to advise him of this.

## 2019-09-03 ENCOUNTER — Other Ambulatory Visit: Payer: Self-pay

## 2019-09-03 MED ORDER — PHENYTOIN 50 MG PO CHEW: CHEWABLE_TABLET | ORAL | 0 refills | Status: DC

## 2019-09-03 MED ORDER — PHENYTOIN SODIUM EXTENDED 100 MG PO CAPS: 300.0000 mg | ORAL_CAPSULE | Freq: Every day | ORAL | 0 refills | Status: DC

## 2019-09-09 NOTE — Progress Notes (Signed)
    Virtual Visit via Video Note  I connected with Marc Johnson on 09/10/19 at  3:45 PM EST by a video enabled telemedicine application and verified that I am speaking with the correct person using two identifiers.  Location: Patient: at his home Provider: in the office    I discussed the limitations of evaluation and management by telemedicine and the availability of in person appointments. The patient expressed understanding and agreed to proceed.  History of Present Illness: 09/10/2019 SS: Marc Johnson is a 49 year old male with history of seizure disorder.  He remains on Dilantin 350 mg daily.  He has not had recurrent seizure in several years.  He is tolerating his medication without side effect. 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  He has a 27-year-old son.  He drives a car without difficulty.  He indicates his overall health has been well, even his allergies have been doing well.  He presents today for evaluation via virtual visit.   HISTORY OF PRESENT ILLNESS:UPDATE 12/30/2019CM Marc Johnson, 49 year old male returns for follow-up with history of seizure disorder.  He has not had seizures in several years.  He is currently on Dilantin 350 mg daily and tolerates this fairly well.  He denies any falls or balance.  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  He returns for reevaluation  Observations/Objective: Via virtual visit, is alert and oriented, speech clear and concise, answers questions appropriately, facial symmetry noted, follows commands, no arm drift, gait is intact  Assessment and Plan: 1. Seizures   He continues to do very well, he has not had recurrent seizure in several years.  He will remain on Dilantin at current dose.  He will come to the office in the next few weeks to have labs checked (needs to get a day off work).  Once labs result, I will refill his medication.  He will call for  recurrent seizure, follow-up in 1 year or sooner if needed  Follow Up Instructions: 09/15/2020 1045   I discussed the assessment and treatment plan with the patient. The patient was provided an opportunity to ask questions and all were answered. The patient agreed with the plan and demonstrated an understanding of the instructions.   The patient was advised to call back or seek an in-person evaluation if the symptoms worsen or if the condition fails to improve as anticipated.  I provided 15 minutes of non-face-to-face time during this encounter.  Otila Kluver, DNP  [facility] [address], [address] [place], [place] [address] [phone]

## 2019-09-10 ENCOUNTER — Encounter: Payer: Self-pay | Admitting: Neurology

## 2019-09-10 ENCOUNTER — Telehealth (INDEPENDENT_AMBULATORY_CARE_PROVIDER_SITE_OTHER): Payer: BC Managed Care – PPO | Admitting: Neurology

## 2019-09-10 DIAGNOSIS — R569 Unspecified convulsions: Secondary | ICD-10-CM | POA: Diagnosis not present

## 2019-09-11 NOTE — Progress Notes (Signed)
I have read the note, and I agree with the clinical assessment and plan.  Daleysa Kristiansen K Roshell Brigham   

## 2019-10-08 ENCOUNTER — Telehealth: Payer: Self-pay

## 2019-10-08 MED ORDER — PHENYTOIN SODIUM EXTENDED 100 MG PO CAPS: 300.0000 mg | ORAL_CAPSULE | Freq: Every day | ORAL | 0 refills | Status: DC

## 2019-10-08 NOTE — Telephone Encounter (Signed)
Needs lab draw, refill for 30 day.

## 2019-10-08 NOTE — Telephone Encounter (Signed)
1) Medication(s) Requested (by name): phenytoin (DILANTIN) 100 MG ER capsule   2) Pharmacy of Choice: [facility] [address] [place], [place] - [address]  [address] [place] [address]

## 2019-10-22 ENCOUNTER — Other Ambulatory Visit: Payer: Self-pay

## 2019-10-22 ENCOUNTER — Other Ambulatory Visit (INDEPENDENT_AMBULATORY_CARE_PROVIDER_SITE_OTHER): Payer: Self-pay

## 2019-10-22 DIAGNOSIS — Z0289 Encounter for other administrative examinations: Secondary | ICD-10-CM

## 2019-10-22 DIAGNOSIS — R569 Unspecified convulsions: Secondary | ICD-10-CM

## 2019-10-23 LAB — COMPREHENSIVE METABOLIC PANEL
ALT: 31 IU/L (ref 0–44)
AST: 20 IU/L (ref 0–40)
Albumin/Globulin Ratio: 1.7 (ref 1.2–2.2)
Albumin: 4.2 g/dL (ref 4.0–5.0)
Alkaline Phosphatase: 113 IU/L (ref 39–117)
BUN/Creatinine Ratio: 9 (ref 9–20)
BUN: 9 mg/dL (ref 6–24)
Bilirubin Total: <0.2 mg/dL (ref 0.0–1.2)
CO2: 23 mmol/L (ref 20–29)
Calcium: 8.8 mg/dL (ref 8.7–10.2)
Chloride: 107 mmol/L — ABNORMAL HIGH (ref 96–106)
Creatinine, Ser: 0.97 mg/dL (ref 0.76–1.27)
GFR calc Af Amer: 105 mL/min/{1.73_m2} (ref >59)
GFR calc non Af Amer: 91 mL/min/{1.73_m2} (ref >59)
Globulin, Total: 2.5 g/dL (ref 1.5–4.5)
Glucose: 107 mg/dL — ABNORMAL HIGH (ref 65–99)
Potassium: 3.6 mmol/L (ref 3.5–5.2)
Sodium: 144 mmol/L (ref 134–144)
Total Protein: 6.7 g/dL (ref 6.0–8.5)

## 2019-10-23 LAB — CBC WITH DIFFERENTIAL/PLATELET
Basophils Absolute: 0.1 10*3/uL (ref 0.0–0.2)
Basos: 1 %
EOS (ABSOLUTE): 0.2 10*3/uL (ref 0.0–0.4)
Eos: 3 %
Hematocrit: 42.8 % (ref 37.5–51.0)
Hemoglobin: 15.3 g/dL (ref 13.0–17.7)
Immature Grans (Abs): 0 10*3/uL (ref 0.0–0.1)
Immature Granulocytes: 0 %
Lymphocytes Absolute: 1.7 10*3/uL (ref 0.7–3.1)
Lymphs: 23 %
MCH: 31.5 pg (ref 26.6–33.0)
MCHC: 35.7 g/dL (ref 31.5–35.7)
MCV: 88 fL (ref 79–97)
Monocytes Absolute: 0.7 10*3/uL (ref 0.1–0.9)
Monocytes: 9 %
Neutrophils Absolute: 4.7 10*3/uL (ref 1.4–7.0)
Neutrophils: 64 %
Platelets: 172 10*3/uL (ref 150–450)
RBC: 4.86 x10E6/uL (ref 4.14–5.80)
RDW: 13 % (ref 11.6–15.4)
WBC: 7.4 10*3/uL (ref 3.4–10.8)

## 2019-10-23 LAB — PHENYTOIN LEVEL, TOTAL: Phenytoin (Dilantin), Serum: 7.7 ug/mL — ABNORMAL LOW (ref 10.0–20.0)

## 2019-10-29 ENCOUNTER — Telehealth: Payer: Self-pay | Admitting: *Deleted

## 2019-10-29 MED ORDER — PHENYTOIN 50 MG PO CHEW: CHEWABLE_TABLET | ORAL | 3 refills | Status: DC

## 2019-10-29 MED ORDER — PHENYTOIN SODIUM EXTENDED 100 MG PO CAPS: 300.0000 mg | ORAL_CAPSULE | Freq: Every day | ORAL | 3 refills | Status: DC

## 2019-10-29 NOTE — Telephone Encounter (Signed)
I spoke to pt and relayed that his labs ok, dilantin was 7.7 low compared to previous years.  He stated that he did not take his dilantin that day. (too much on his plate, forgot).  He has had no seizures. He understands that he could have seizure if misses doses (would stop driving for 6 months).  He understands.  I will send in 90 day supplies to pharmacy.  He asked about glucose, I relayed that could have been elevated due to not fasting lab.  Can send to pcp, he stated that was not necessary.

## 2019-10-29 NOTE — Telephone Encounter (Signed)
-----   Message from Glean Salvo, NP sent at 10/23/2019  8:10 AM EST ----- Please call the patient. CMP was stable,  CBC was normal. Dilantin level was slightly low at 7.7. The last 4 years has been in normal range. Any reason to account for being low? Not taking med? According to my last note, he has not had seizure in several years. He can continue at current dose and monitor or we can redraw again. Has been on same dose for many years.

## 2019-10-29 NOTE — Telephone Encounter (Signed)
LMVM for pt to return call.   

## 2019-11-08 ENCOUNTER — Telehealth: Payer: Self-pay | Admitting: Neurology

## 2019-11-08 NOTE — Telephone Encounter (Signed)
I called [facility], Pt picked up both prescriptions today at 1537.

## 2019-11-08 NOTE — Telephone Encounter (Signed)
[name] from [facility] in [place] called needing RN to resend the prescriptions for the pts phenytoin (DILANTIN) 100 MG ER capsule and the phenytoin (PHENYTOIN INFATABS) 50 MG tablet because they have not received them. Please advise.

## 2020-09-15 ENCOUNTER — Ambulatory Visit: Payer: Managed Care, Other (non HMO) | Admitting: Neurology

## 2020-09-15 NOTE — Progress Notes (Deleted)
PATIENT: Marc Johnson DOB: June 12, 1971  REASON FOR VISIT: follow up HISTORY FROM: patient  HISTORY OF PRESENT ILLNESS: Today 09/15/20  Marc Johnson is a 50 year old male with history of seizure disorder.  Remains on Dilantin 350 mg daily.  HISTORY  09/10/2019 SS: Marc Johnson is a 50 year old male with history of seizure disorder.  He remains on Dilantin 350 mg daily.  He has not had recurrent seizure in several years.  He is tolerating his medication without side effect. 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  He has a 32-year-old son.  He drives a car without difficulty.  He indicates his overall health has been well, even his allergies have been doing well.  He presents today for evaluation via virtual visit.   REVIEW OF SYSTEMS: Out of a complete 14 system review of symptoms, the patient complains only of the following symptoms, and all other reviewed systems are negative.  ALLERGIES: Allergies  Allergen Reactions  . Dust Mite Extract   . Mold Extract [Trichophyton]   . Pollen Extract   . Amoxicillin-Pot Clavulanate Other (See Comments)    GI intolerance    HOME MEDICATIONS: Outpatient Medications Prior to Visit  Medication Sig Dispense Refill  . cholecalciferol (VITAMIN D) 1000 UNITS tablet Take 1,000 Units by mouth daily.    . phenytoin (DILANTIN) 100 MG ER capsule Take 3 capsules (300 mg total) by mouth daily. 270 capsule 3  . phenytoin (PHENYTOIN INFATABS) 50 MG tablet TAKE 1 TABLET BY MOUTH ONCE DAILY 90 tablet 3   No facility-administered medications prior to visit.    PAST MEDICAL HISTORY: Past Medical History:  Diagnosis Date  . Convulsions/seizures (HCC) 06/11/2013    PAST SURGICAL HISTORY: Past Surgical History:  Procedure Laterality Date  . none      FAMILY HISTORY: Family History  Problem Relation Age of Onset  . Seizures Mother   . Seizures Maternal Grandfather     SOCIAL HISTORY: Social History   Socioeconomic History   . Marital status: Married    Spouse name: Efraim Kaufmann  . Number of children: 1  . Years of education: college  . Highest education level: Not on file  Occupational History    Employer: [employer]  Tobacco Use  . Smoking status: Never Smoker  . Smokeless tobacco: Never Used  Substance and Sexual Activity  . Alcohol use: No  . Drug use: No  . Sexual activity: Not on file  Other Topics Concern  . Not on file  Social History Narrative  . Not on file   Social Determinants of Health   Financial Resource Strain: Not on file  Food Insecurity: Not on file  Transportation Needs: Not on file  Physical Activity: Not on file  Stress: Not on file  Social Connections: Not on file  Intimate Partner Violence: Not on file      PHYSICAL EXAM  There were no vitals filed for this visit. There is no height or weight on file to calculate BMI.  Generalized: Well developed, in no acute distress   Neurological examination  Mentation: Alert oriented to time, place, history taking. Follows all commands speech and language fluent Cranial nerve II-XII: Pupils were equal round reactive to light. Extraocular movements were full, visual field were full on confrontational test. Facial sensation and strength were normal. Uvula tongue midline. Head turning and shoulder shrug  were normal and symmetric. Motor: The motor testing reveals 5 over 5 strength of  all 4 extremities. Good symmetric motor tone is noted throughout.  Sensory: Sensory testing is intact to soft touch on all 4 extremities. No evidence of extinction is noted.  Coordination: Cerebellar testing reveals good finger-nose-finger and heel-to-shin bilaterally.  Gait and station: Gait is normal. Tandem gait is normal. Romberg is negative. No drift is seen.  Reflexes: Deep tendon reflexes are symmetric and normal bilaterally.   DIAGNOSTIC DATA (LABS, IMAGING, TESTING) - I reviewed patient records, labs, notes, testing and imaging myself  where available.  Lab Results  Component Value Date   WBC 7.4 10/22/2019   HGB 15.3 10/22/2019   HCT 42.8 10/22/2019   MCV 88 10/22/2019   PLT 172 10/22/2019      Component Value Date/Time   NA 144 10/22/2019 1550   K 3.6 10/22/2019 1550   CL 107 (H) 10/22/2019 1550   CO2 23 10/22/2019 1550   GLUCOSE 107 (H) 10/22/2019 1550   GLUCOSE 108 (H) 09/15/2013 0721   BUN 9 10/22/2019 1550   CREATININE 0.97 10/22/2019 1550   CALCIUM 8.8 10/22/2019 1550   PROT 6.7 10/22/2019 1550   ALBUMIN 4.2 10/22/2019 1550   AST 20 10/22/2019 1550   ALT 31 10/22/2019 1550   ALKPHOS 113 10/22/2019 1550   BILITOT <0.2 10/22/2019 1550   GFRNONAA 91 10/22/2019 1550   GFRAA 105 10/22/2019 1550   No results found for: CHOL, HDL, LDLCALC, LDLDIRECT, TRIG, CHOLHDL No results found for: KNLZ7Q No results found for: VITAMINB12 No results found for: TSH    ASSESSMENT AND PLAN 51 y.o. year old male  has a past medical history of Convulsions/seizures (HCC) (06/11/2013). here with ***   I spent 15 minutes with the patient. 50% of this time was spent   Margie Ege, [place], DNP 09/15/2020, 5:28 AM [facility] [address], [address] [place], [place] [address] [phone]

## 2020-10-29 ENCOUNTER — Other Ambulatory Visit (INDEPENDENT_AMBULATORY_CARE_PROVIDER_SITE_OTHER): Payer: Self-pay

## 2020-10-29 ENCOUNTER — Other Ambulatory Visit: Payer: Self-pay | Admitting: Neurology

## 2020-10-29 ENCOUNTER — Telehealth: Payer: Self-pay | Admitting: Neurology

## 2020-10-29 DIAGNOSIS — Z0289 Encounter for other administrative examinations: Secondary | ICD-10-CM

## 2020-10-29 DIAGNOSIS — R569 Unspecified convulsions: Secondary | ICD-10-CM

## 2020-10-29 NOTE — Telephone Encounter (Signed)
PT. Came in today for lab work and told me that he needed a refill on both of his Phenytoin prescriptions.

## 2020-10-30 ENCOUNTER — Other Ambulatory Visit: Payer: Self-pay | Admitting: Emergency Medicine

## 2020-10-30 LAB — CBC WITH DIFFERENTIAL/PLATELET
Basophils Absolute: 0.1 10*3/uL (ref 0.0–0.2)
Basos: 1 %
EOS (ABSOLUTE): 0.2 10*3/uL (ref 0.0–0.4)
Eos: 2 %
Hematocrit: 43.7 % (ref 37.5–51.0)
Hemoglobin: 15.3 g/dL (ref 13.0–17.7)
Immature Grans (Abs): 0 10*3/uL (ref 0.0–0.1)
Immature Granulocytes: 0 %
Lymphocytes Absolute: 1.7 10*3/uL (ref 0.7–3.1)
Lymphs: 25 %
MCH: 30.8 pg (ref 26.6–33.0)
MCHC: 35 g/dL (ref 31.5–35.7)
MCV: 88 fL (ref 79–97)
Monocytes Absolute: 0.6 10*3/uL (ref 0.1–0.9)
Monocytes: 9 %
Neutrophils Absolute: 4.1 10*3/uL (ref 1.4–7.0)
Neutrophils: 63 %
Platelets: 174 10*3/uL (ref 150–450)
RBC: 4.96 x10E6/uL (ref 4.14–5.80)
RDW: 13.8 % (ref 11.6–15.4)
WBC: 6.7 10*3/uL (ref 3.4–10.8)

## 2020-10-30 LAB — COMPREHENSIVE METABOLIC PANEL
ALT: 25 IU/L (ref 0–44)
AST: 18 IU/L (ref 0–40)
Albumin/Globulin Ratio: 1.5 (ref 1.2–2.2)
Albumin: 4.1 g/dL (ref 3.8–4.9)
Alkaline Phosphatase: 97 IU/L (ref 44–121)
BUN/Creatinine Ratio: 11 (ref 9–20)
BUN: 11 mg/dL (ref 6–24)
Bilirubin Total: <0.2 mg/dL (ref 0.0–1.2)
CO2: 21 mmol/L (ref 20–29)
Calcium: 9.1 mg/dL (ref 8.7–10.2)
Chloride: 104 mmol/L (ref 96–106)
Creatinine, Ser: 0.97 mg/dL (ref 0.76–1.27)
GFR calc Af Amer: 104 mL/min/{1.73_m2} (ref >59)
GFR calc non Af Amer: 90 mL/min/{1.73_m2} (ref >59)
Globulin, Total: 2.8 g/dL (ref 1.5–4.5)
Glucose: 125 mg/dL — ABNORMAL HIGH (ref 65–99)
Potassium: 4.2 mmol/L (ref 3.5–5.2)
Sodium: 141 mmol/L (ref 134–144)
Total Protein: 6.9 g/dL (ref 6.0–8.5)

## 2020-10-30 LAB — PHENYTOIN LEVEL, TOTAL: Phenytoin (Dilantin), Serum: 5.7 ug/mL — ABNORMAL LOW (ref 10.0–20.0)

## 2020-10-30 MED ORDER — PHENYTOIN 50 MG PO CHEW: CHEWABLE_TABLET | ORAL | 0 refills | Status: DC

## 2020-10-30 MED ORDER — PHENYTOIN SODIUM EXTENDED 100 MG PO CAPS: 300.0000 mg | ORAL_CAPSULE | Freq: Every day | ORAL | 0 refills | Status: DC

## 2020-10-30 NOTE — Telephone Encounter (Signed)
Message left on mychart that patient will need appointment for further refills.    Attempted to call but received a message of the mailbox not being set up

## 2020-11-05 ENCOUNTER — Telehealth: Payer: Self-pay

## 2020-11-05 NOTE — Telephone Encounter (Signed)
-----   Message from Glean Salvo, NP sent at 11/03/2020  7:43 AM EST ----- Labs are unremarkable, Dilantin level is in the low range at 5.7.  Just make sure taking medication, as long as no recurrent seizure or pre-seizure feeling, can continue at current dosing. The level is lower than previous, any particular reason? Weight loss? Getting trough now vs not before? Is he truly taking the medication faithfully, anything to account for changes in level compared to previous? Once you talk with him, let me know if he needs refills.

## 2020-11-05 NOTE — Telephone Encounter (Signed)
lvm for call back.

## 2020-11-10 ENCOUNTER — Telehealth: Payer: Self-pay

## 2020-11-10 NOTE — Telephone Encounter (Signed)
Unable to LVM, VM full.

## 2020-11-10 NOTE — Telephone Encounter (Signed)
-----   Message from Sarah J Slack, NP sent at 11/03/2020  7:43 AM EST ----- Labs are unremarkable, Dilantin level is in the low range at 5.7.  Just make sure taking medication, as long as no recurrent seizure or pre-seizure feeling, can continue at current dosing. The level is lower than previous, any particular reason? Weight loss? Getting trough now vs not before? Is he truly taking the medication faithfully, anything to account for changes in level compared to previous? Once you talk with him, let me know if he needs refills.  

## 2020-12-02 NOTE — Progress Notes (Signed)
PATIENT: Marc Johnson DOB: [birth year]  REASON FOR VISIT: follow up HISTORY FROM: patient  HISTORY OF PRESENT ILLNESS: Today 12/03/20 Marc Johnson is a 50 year old male with history of seizure disorder.  He remains on Dilantin, no recurrent seizure in several years.  Came in March 2022, Dilantin level was on the low end 5.7.  CBC, CMP were unremarkable, except random glucose was 125.  Unclear reason for low and Dilantin level, lower than prior.  Has excellent compliance with medication.  Does report significant Covid infection in December, at the time of his last blood draw, his wife had just had a hysterectomy, he hadn't been eating well, not much rest.  Continues to work full-time for [employer].  He has a 88-year-old son.  Indicates he has been doing great.  Weight has been stable from last on file in December 2019.  Here today for evaluation unaccompanied.  HISTORY 09/10/2019 SS: Marc Johnson is a 50 year old male with history of seizure disorder.  He remains on Dilantin 350 mg daily.  He has not had recurrent seizure in several years.  He is tolerating his medication without side effect. 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  He has a 52-year-old son.  He drives a car without difficulty.  He indicates his overall health has been well, even his allergies have been doing well.  He presents today for evaluation via virtual visit.   REVIEW OF SYSTEMS: Out of a complete 14 system review of symptoms, the patient complains only of the following symptoms, and all other reviewed systems are negative.  n/a  ALLERGIES: Allergies  Allergen Reactions  . Dust Mite Extract   . Mold Extract [Trichophyton]   . Pollen Extract   . Amoxicillin-Pot Clavulanate Other (See Comments)    GI intolerance    HOME MEDICATIONS: Outpatient Medications Prior to Visit  Medication Sig Dispense Refill  . cholecalciferol (VITAMIN D) 1000 UNITS tablet Take 1,000 Units by mouth daily.    .  phenytoin (DILANTIN) 100 MG ER capsule Take 3 capsules (300 mg total) by mouth daily. 90 capsule 0  . phenytoin (PHENYTOIN INFATABS) 50 MG tablet TAKE 1 TABLET BY MOUTH ONCE DAILY 30 tablet 0   No facility-administered medications prior to visit.    PAST MEDICAL HISTORY: Past Medical History:  Diagnosis Date  . Convulsions/seizures (HCC) 06/11/2013    PAST SURGICAL HISTORY: Past Surgical History:  Procedure Laterality Date  . none      FAMILY HISTORY: Family History  Problem Relation Age of Onset  . Seizures Mother   . Seizures Maternal Grandfather     SOCIAL HISTORY: Social History   Socioeconomic History  . Marital status: Married    Spouse name: Efraim Kaufmann  . Number of children: 1  . Years of education: college  . Highest education level: Not on file  Occupational History    Employer: [employer]  Tobacco Use  . Smoking status: Never Smoker  . Smokeless tobacco: Never Used  Substance and Sexual Activity  . Alcohol use: No  . Drug use: No  . Sexual activity: Not on file  Other Topics Concern  . Not on file  Social History Narrative  . Not on file   Social Determinants of Health   Financial Resource Strain: Not on file  Food Insecurity: Not on file  Transportation Needs: Not on file  Physical Activity: Not on file  Stress: Not on file  Social Connections: Not on  file  Intimate Partner Violence: Not on file      PHYSICAL EXAM  Vitals:   12/03/20 0821  BP: 130/89  Pulse: 81  Weight: 218 lb (98.9 kg)  Height: 5\' 10"  (1.778 m)   Body mass index is 31.28 kg/m.  Generalized: Well developed, in no acute distress   Neurological examination  Mentation: Alert oriented to time, place, history taking. Follows all commands speech and language fluent Cranial nerve II-XII: Pupils were equal round reactive to light. Extraocular movements were full, visual field were full on confrontational test. Facial sensation and strength were normal. Uvula tongue  midline. Head turning and shoulder shrug  were normal and symmetric. Motor: The motor testing reveals 5 over 5 strength of all 4 extremities. Good symmetric motor tone is noted throughout.  Sensory: Sensory testing is intact to soft touch on all 4 extremities. No evidence of extinction is noted.  Coordination: Cerebellar testing reveals good finger-nose-finger and heel-to-shin bilaterally.  Gait and station: Gait is normal. Tandem gait is normal.  Reflexes: Deep tendon reflexes are symmetric and normal bilaterally.   DIAGNOSTIC DATA (LABS, IMAGING, TESTING) - I reviewed patient records, labs, notes, testing and imaging myself where available.  Lab Results  Component Value Date   WBC 6.7 10/29/2020   HGB 15.3 10/29/2020   HCT 43.7 10/29/2020   MCV 88 10/29/2020   PLT 174 10/29/2020      Component Value Date/Time   NA 141 10/29/2020 1147   K 4.2 10/29/2020 1147   CL 104 10/29/2020 1147   CO2 21 10/29/2020 1147   GLUCOSE 125 (H) 10/29/2020 1147   GLUCOSE 108 (H) 09/15/2013 0721   BUN 11 10/29/2020 1147   CREATININE 0.97 10/29/2020 1147   CALCIUM 9.1 10/29/2020 1147   PROT 6.9 10/29/2020 1147   ALBUMIN 4.1 10/29/2020 1147   AST 18 10/29/2020 1147   ALT 25 10/29/2020 1147   ALKPHOS 97 10/29/2020 1147   BILITOT <0.2 10/29/2020 1147   GFRNONAA 90 10/29/2020 1147   GFRAA 104 10/29/2020 1147   No results found for: CHOL, HDL, LDLCALC, LDLDIRECT, TRIG, CHOLHDL No results found for: 10/31/2020 No results found for: VITAMINB12 No results found for: TSH  ASSESSMENT AND PLAN 50 y.o. year old male  has a past medical history of Convulsions/seizures (HCC) (06/11/2013). here with:  1.  Seizure disorder  -No recurrent seizure in several years -Will recheck Dilantin level, came in Feb was low 5.7, no reason for this, there have been no missed doses; since doing well, inclined to keep as is -Refill sent for Dilantin total 350 mg at bedtime -Call for seizure activity, otherwise follow-up 1  year or sooner if needed  I spent 20 minutes of face-to-face and non-face-to-face time with patient.  This included previsit chart review, lab review, study review, order entry, electronic health record documentation, patient education.  Mar, AGNP-C, DNP 12/03/2020, 9:12 AM [facility] [address], [address] [place], [place] [phone]

## 2020-12-03 ENCOUNTER — Encounter: Payer: Self-pay | Admitting: Neurology

## 2020-12-03 ENCOUNTER — Ambulatory Visit (INDEPENDENT_AMBULATORY_CARE_PROVIDER_SITE_OTHER): Payer: BC Managed Care – PPO | Admitting: Neurology

## 2020-12-03 VITALS — BP 130/89 | HR 81 | Ht 70.0 in | Wt 218.0 lb

## 2020-12-03 DIAGNOSIS — R569 Unspecified convulsions: Secondary | ICD-10-CM | POA: Diagnosis not present

## 2020-12-03 MED ORDER — PHENYTOIN SODIUM EXTENDED 100 MG PO CAPS: 300.0000 mg | ORAL_CAPSULE | Freq: Every day | ORAL | 4 refills | Status: DC

## 2020-12-03 MED ORDER — PHENYTOIN 50 MG PO CHEW: CHEWABLE_TABLET | ORAL | 4 refills | Status: DC

## 2020-12-03 NOTE — Progress Notes (Signed)
I have read the note, and I agree with the clinical assessment and plan.  Ermie Glendenning K Jamicheal Heard   

## 2020-12-03 NOTE — Patient Instructions (Signed)
Check dilantin level Continue current medications Call for seizure activity  See you back in 1 year

## 2020-12-04 ENCOUNTER — Telehealth: Payer: Self-pay

## 2020-12-04 LAB — PHENYTOIN LEVEL, TOTAL: Phenytoin (Dilantin), Serum: 9.6 ug/mL — ABNORMAL LOW (ref 10.0–20.0)

## 2020-12-04 NOTE — Telephone Encounter (Signed)
-----   Message from Glean Salvo, NP sent at 12/04/2020  9:41 AM EDT ----- Sent my chart message: Fayrene Fearing,  Dilantin level is improved at 9.6, normal is 10-20. This is up from 1 month ago at 5.7.  We can continue with current dosing, please call for any problems or concerns. See you back in 1 year! Take Care,  Maralyn Sago

## 2021-12-03 ENCOUNTER — Encounter: Payer: Self-pay | Admitting: Neurology

## 2021-12-03 ENCOUNTER — Ambulatory Visit (INDEPENDENT_AMBULATORY_CARE_PROVIDER_SITE_OTHER): Payer: BC Managed Care – PPO | Admitting: Neurology

## 2021-12-03 VITALS — BP 117/84 | HR 82 | Ht 70.0 in | Wt 242.0 lb

## 2021-12-03 DIAGNOSIS — R569 Unspecified convulsions: Secondary | ICD-10-CM | POA: Diagnosis not present

## 2021-12-03 MED ORDER — PHENYTOIN 50 MG PO CHEW: CHEWABLE_TABLET | ORAL | 4 refills | Status: DC

## 2021-12-03 MED ORDER — PHENYTOIN SODIUM EXTENDED 100 MG PO CAPS: 300.0000 mg | ORAL_CAPSULE | Freq: Every day | ORAL | 4 refills | Status: DC

## 2021-12-03 NOTE — Progress Notes (Signed)
? ? ?PATIENT: Marc Johnson ?DOB: 26-Sep-1970 ? ?REASON FOR VISIT: follow up for seizures ?HISTORY FROM: patient ?PRIMARY NEUROLOGIST: Dr. Teresa Coombs ? ?HISTORY OF PRESENT ILLNESS: ?Today 12/03/21 ?Marc Johnson here today for follow-up.  Dilantin level was 9.6 at last visit. Doing well. Working new schedule at [employer] 8-5, no seizures, doing well. Also is a Education officer, environmental. Tolerates Dilantin well. Has an 53 year old. Did mention he feels better when Dilantin level is on lower end, feels clearer headed. In past was on Dilantin 400 mg daily. Is careful to not miss sleep. Takes Vitamin D. ? ?Update 12/03/20 SS: Marc Johnson is a 51 year old male with history of seizure disorder.  He remains on Dilantin, no recurrent seizure in several years.  Came in March 2022, Dilantin level was on the low end 5.7.  CBC, CMP were unremarkable, except random glucose was 125.  Unclear reason for low and Dilantin level, lower than prior.  Has excellent compliance with medication.  Does report significant Covid infection in December, at the time of his last blood draw, his wife had just had a hysterectomy, he hadn't been eating well, not much rest.  Continues to work full-time for [employer].  He has a 11-year-old son.  Indicates he has been doing great.  Weight has been stable from last on file in December 2019.  Here today for evaluation unaccompanied. ? ?HISTORY ?09/10/2019 SS: Marc Johnson is a 51 year old male with history of seizure disorder.  He remains on Dilantin 350 mg daily.  He has not had recurrent seizure in several years.  He is tolerating his medication without side effect. 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  He has a 53-year-old son.  He drives a car without difficulty.  He indicates his overall health has been well, even his allergies have been doing well.  He presents today for evaluation via virtual visit.  ? ?REVIEW OF SYSTEMS: Out of a complete 14 system review of symptoms, the patient complains only of the  following symptoms, and all other reviewed systems are negative. ? ?See HPI ? ?ALLERGIES: ?Allergies  ?Allergen Reactions  ? Dust Mite Extract   ? Mold Extract [Trichophyton]   ? Pollen Extract   ? Amoxicillin-Pot Clavulanate Other (See Comments)  ?  GI intolerance  ? ? ?HOME MEDICATIONS: ?Outpatient Medications Prior to Visit  ?Medication Sig Dispense Refill  ? cholecalciferol (VITAMIN D) 1000 UNITS tablet Take 1,000 Units by mouth daily.    ? phenytoin (DILANTIN) 100 MG ER capsule Take 3 capsules (300 mg total) by mouth daily. 270 capsule 4  ? phenytoin (PHENYTOIN INFATABS) 50 MG tablet TAKE 1 TABLET BY MOUTH ONCE DAILY 90 tablet 4  ? ?No facility-administered medications prior to visit.  ? ? ?PAST MEDICAL HISTORY: ?Past Medical History:  ?Diagnosis Date  ? Convulsions/seizures (HCC) 06/11/2013  ? ? ?PAST SURGICAL HISTORY: ?Past Surgical History:  ?Procedure Laterality Date  ? none    ? ? ?FAMILY HISTORY: ?Family History  ?Problem Relation Age of Onset  ? Seizures Mother   ? Seizures Maternal Grandfather   ? ? ?SOCIAL HISTORY: ?Social History  ? ?Socioeconomic History  ? Marital status: Married  ?  Spouse name: Efraim Kaufmann  ? Number of children: 1  ? Years of education: college  ? Highest education level: Not on file  ?Occupational History  ?  Employer: [employer]  ?Tobacco Use  ? Smoking status: Never  ? Smokeless tobacco: Never  ?Substance and Sexual  Activity  ? Alcohol use: No  ? Drug use: No  ? Sexual activity: Not on file  ?Other Topics Concern  ? Not on file  ?Social History Narrative  ? Not on file  ? ?Social Determinants of Health  ? ?Financial Resource Strain: Not on file  ?Food Insecurity: Not on file  ?Transportation Needs: Not on file  ?Physical Activity: Not on file  ?Stress: Not on file  ?Social Connections: Not on file  ?Intimate Partner Violence: Not on file  ? ?PHYSICAL EXAM ? ?Vitals:  ? 12/03/21 1543  ?BP: 117/84  ?Pulse: 82  ?Weight: 242 lb (109.8 kg)  ?Height: 5\' 10"  (1.778 m)  ? ? ?Body  mass index is 34.72 kg/m?. ? ?Generalized: Well developed, in no acute distress  ? ?Neurological examination  ?Mentation: Alert oriented to time, place, history taking. Follows all commands speech and language fluent ?Cranial nerve II-XII: Pupils were equal round reactive to light. Extraocular movements were full, visual field were full on confrontational test. Facial sensation and strength were normal. Head turning and shoulder shrug  were normal and symmetric. ?Motor: The motor testing reveals 5 over 5 strength of all 4 extremities. Good symmetric motor tone is noted throughout.  ?Sensory: Sensory testing is intact to soft touch on all 4 extremities. No evidence of extinction is noted.  ?Coordination: Cerebellar testing reveals good finger-nose-finger and heel-to-shin bilaterally.  ?Gait and station: Gait is normal. Tandem gait is normal.  ?Reflexes: Deep tendon reflexes are symmetric and normal bilaterally.  ? ?DIAGNOSTIC DATA (LABS, IMAGING, TESTING) ?- I reviewed patient records, labs, notes, testing and imaging myself where available. ? ?Lab Results  ?Component Value Date  ? WBC 6.7 10/29/2020  ? HGB 15.3 10/29/2020  ? HCT 43.7 10/29/2020  ? MCV 88 10/29/2020  ? PLT 174 10/29/2020  ? ?   ?Component Value Date/Time  ? NA 141 10/29/2020 1147  ? K 4.2 10/29/2020 1147  ? CL 104 10/29/2020 1147  ? CO2 21 10/29/2020 1147  ? GLUCOSE 125 (H) 10/29/2020 1147  ? GLUCOSE 108 (H) 09/15/2013 0721  ? BUN 11 10/29/2020 1147  ? CREATININE 0.97 10/29/2020 1147  ? CALCIUM 9.1 10/29/2020 1147  ? PROT 6.9 10/29/2020 1147  ? ALBUMIN 4.1 10/29/2020 1147  ? AST 18 10/29/2020 1147  ? ALT 25 10/29/2020 1147  ? ALKPHOS 97 10/29/2020 1147  ? BILITOT <0.2 10/29/2020 1147  ? GFRNONAA 90 10/29/2020 1147  ? GFRAA 104 10/29/2020 1147  ? ?No results found for: CHOL, HDL, LDLCALC, LDLDIRECT, TRIG, CHOLHDL ?No results found for: HGBA1C ?No results found for: VITAMINB12 ?No results found for: TSH ? ?ASSESSMENT AND PLAN ?51 y.o. year old male   has a past medical history of Convulsions/seizures (HCC) (06/11/2013). here with: ? ?1.  Seizure disorder ? ?-Continues to do well, no recent seizures ?-Check labs today  ?-Continue Dilantin total 350 mg at bedtime ?-Call for seizures, return back in 1 year, followed by Dr. 08/11/2013 ? ?Teresa Coombs, AGNP-C, DNP 12/03/2021, 4:09 PM ?[facility] ?[address], [address] ?[place], [place] ?([phone] ? ?

## 2021-12-04 LAB — CBC WITH DIFFERENTIAL/PLATELET
Basophils Absolute: 0.1 10*3/uL (ref 0.0–0.2)
Basos: 1 %
EOS (ABSOLUTE): 0.2 10*3/uL (ref 0.0–0.4)
Eos: 2 %
Hematocrit: 45.5 % (ref 37.5–51.0)
Hemoglobin: 16.3 g/dL (ref 13.0–17.7)
Immature Grans (Abs): 0 10*3/uL (ref 0.0–0.1)
Immature Granulocytes: 0 %
Lymphocytes Absolute: 1.7 10*3/uL (ref 0.7–3.1)
Lymphs: 25 %
MCH: 31.8 pg (ref 26.6–33.0)
MCHC: 35.8 g/dL — ABNORMAL HIGH (ref 31.5–35.7)
MCV: 89 fL (ref 79–97)
Monocytes Absolute: 0.6 10*3/uL (ref 0.1–0.9)
Monocytes: 8 %
Neutrophils Absolute: 4.4 10*3/uL (ref 1.4–7.0)
Neutrophils: 64 %
Platelets: 174 10*3/uL (ref 150–450)
RBC: 5.13 x10E6/uL (ref 4.14–5.80)
RDW: 12.9 % (ref 11.6–15.4)
WBC: 7 10*3/uL (ref 3.4–10.8)

## 2021-12-04 LAB — COMPREHENSIVE METABOLIC PANEL
ALT: 24 IU/L (ref 0–44)
AST: 20 IU/L (ref 0–40)
Albumin/Globulin Ratio: 1.4 (ref 1.2–2.2)
Albumin: 4.2 g/dL (ref 3.8–4.9)
Alkaline Phosphatase: 100 IU/L (ref 44–121)
BUN/Creatinine Ratio: 10 (ref 9–20)
BUN: 9 mg/dL (ref 6–24)
Bilirubin Total: 0.2 mg/dL (ref 0.0–1.2)
CO2: 25 mmol/L (ref 20–29)
Calcium: 9.5 mg/dL (ref 8.7–10.2)
Chloride: 104 mmol/L (ref 96–106)
Creatinine, Ser: 0.92 mg/dL (ref 0.76–1.27)
Globulin, Total: 3 g/dL (ref 1.5–4.5)
Glucose: 107 mg/dL — ABNORMAL HIGH (ref 70–99)
Potassium: 4 mmol/L (ref 3.5–5.2)
Sodium: 140 mmol/L (ref 134–144)
Total Protein: 7.2 g/dL (ref 6.0–8.5)
eGFR: 100 mL/min/{1.73_m2} (ref >59)

## 2021-12-04 LAB — PHENYTOIN LEVEL, TOTAL: Phenytoin (Dilantin), Serum: 6.7 ug/mL — ABNORMAL LOW (ref 10.0–20.0)

## 2022-10-26 ENCOUNTER — Telehealth: Payer: Self-pay | Admitting: Neurology

## 2022-10-26 NOTE — Telephone Encounter (Signed)
Spoke with pt regarding need to reschedule 12/09/22 appointment with Sarah due to her being out. Pt rescheduled to 01/10/23 and pt wanted to make sure he had enough refills on his medications that will last him until then

## 2022-10-26 NOTE — Telephone Encounter (Signed)
Patient can request if needed before appointment and we can fill to get him there. We will refill at time of need

## 2022-12-07 ENCOUNTER — Telehealth: Payer: Self-pay | Admitting: Neurology

## 2022-12-07 NOTE — Telephone Encounter (Signed)
LVM and sent mychart msg informing pt of need to reschedule 01/10/23 appointment - NP out

## 2022-12-09 ENCOUNTER — Ambulatory Visit: Payer: BC Managed Care – PPO | Admitting: Neurology

## 2022-12-18 ENCOUNTER — Other Ambulatory Visit: Payer: Self-pay | Admitting: Neurology

## 2022-12-19 ENCOUNTER — Other Ambulatory Visit: Payer: Self-pay | Admitting: Neurology

## 2022-12-20 NOTE — Telephone Encounter (Signed)
Requested Prescriptions   Pending Prescriptions Disp Refills   PHENYTOIN INFATABS 50 MG tablet [Pharmacy Med Name: PHENYTOIN 50 MG INFATAB CHEW] 90 tablet 3    Sig: TAKE 1 TABLET EVERY DAY   Last seen 12/03/21 by slack, upcoming appt on 05/04/23. One for extended release and one regular  Got an interaction listed when I tried to fill. Wanted to run it by provider to make sure ok to fill... Duplicate Therapy: phenytoin, Phenytoin InfatabsHYDANTOINS, ANTIARRHYTHMIC - CLASS IB. No Abuse/Dependency Potential. Details Override reason     PHENYTOIN INFATABS 50 MG tablet [Pharmacy Med Name: PHENYTOIN 50 MG INFATAB CHEW], Daily Prescription. Reordered. Long-term. phenytoin (DILANTIN) 100 MG ER capsule, DailyPrescription. Active. Long-term.

## 2022-12-21 ENCOUNTER — Telehealth: Payer: Self-pay | Admitting: Neurology

## 2022-12-21 ENCOUNTER — Other Ambulatory Visit: Payer: Self-pay

## 2022-12-21 MED ORDER — PHENYTOIN 50 MG PO CHEW: 50.0000 mg | CHEWABLE_TABLET | Freq: Every day | ORAL | 2 refills | Status: DC

## 2022-12-21 MED ORDER — PHENYTOIN SODIUM EXTENDED 100 MG PO CAPS: 300.0000 mg | ORAL_CAPSULE | Freq: Every day | ORAL | 2 refills | Status: DC

## 2022-12-21 NOTE — Progress Notes (Signed)
Meds ordered this encounter  Medications   phenytoin (DILANTIN) 100 MG ER capsule    Sig: Take 3 capsules (300 mg total) by mouth daily.    Dispense:  270 capsule    Refill:  2    Must make FU for future refills.   phenytoin (PHENYTOIN INFATABS) 50 MG tablet    Sig: Chew 1 tablet (50 mg total) by mouth daily.    Dispense:  90 tablet    Refill:  2

## 2022-12-21 NOTE — Telephone Encounter (Signed)
Pt called requesting a refill on phenytoin (DILANTIN) 100 MG ER capsule andPHENYTOIN INFATABS 50 MG tablet. Should be sent to [id number]. Pt has an appointment for 8/28 @ 2:15 pm. Pt is asking that medication be refilled stated he has had three appointments canceled because the provider was out.

## 2022-12-21 NOTE — Telephone Encounter (Signed)
Pt called back. Requesting prescriptions be sent to [id number].

## 2022-12-21 NOTE — Telephone Encounter (Signed)
Left msg for pt to call back to see if he needed rx changed from where we sent yesterday?

## 2023-01-10 ENCOUNTER — Ambulatory Visit: Payer: BC Managed Care – PPO | Admitting: Neurology

## 2023-04-24 ENCOUNTER — Encounter: Payer: Self-pay | Admitting: Neurology

## 2023-05-03 NOTE — Progress Notes (Unsigned)
PATIENT: Marc Johnson DOB: 02-19-1971  REASON FOR VISIT: follow up for seizures HISTORY FROM: patient PRIMARY NEUROLOGIST: Dr. Teresa Coombs  HISTORY OF PRESENT ILLNESS: Today 05/04/23 No seizures, remains on Dilantin total 350 mg daily. Still working at [employer] call center.  Labs last visit March 2023, Dilantin level 6.7. his last seizure was in 2015. Seizures started in his 30's. Have been generalized at worst, the most recent were zoning out. Has not been on any other seizure medications. 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   Update 12/03/21 SS: Fayrene Fearing here today for follow-up.  Dilantin level was 9.6 at last visit. Doing well. Working new schedule at [employer] 8-5, no seizures, doing well. Also is a Education officer, environmental. Tolerates Dilantin well. Has an 54 year old. Did mention he feels better when Dilantin level is on lower end, feels clearer headed. In past was on Dilantin 400 mg daily. Is careful to not miss sleep. Takes Vitamin D.  Update 12/03/20 SS: Mr. [name] is a 52 year old male with history of seizure disorder.  He remains on Dilantin, no recurrent seizure in several years.  Came in March 2022, Dilantin level was on the low end 5.7.  CBC, CMP were unremarkable, except random glucose was 125.  Unclear reason for low and Dilantin level, lower than prior.  Has excellent compliance with medication.  Does report significant Covid infection in December, at the time of his last blood draw, his wife had just had a hysterectomy, he hadn't been eating well, not much rest.  Continues to work full-time for [employer].  He has a 62-year-old son.  Indicates he has been doing great.  Weight has been stable from last on file in December 2019.  Here today for evaluation unaccompanied.  HISTORY 09/10/2019 SS: Mr. [name] is a 52 year old male with history of seizure disorder.  He remains on Dilantin 350 mg daily.  He has not had recurrent seizure in several years.  He is tolerating his medication without side effect. 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  He has a 34-year-old son.  He drives a car without difficulty.  He indicates his overall health has been well, even his allergies have been doing well.  He presents today for evaluation via virtual visit.   REVIEW OF SYSTEMS: Out of a complete 14 system review of symptoms, the patient complains only of the following symptoms, and all other reviewed systems are negative.  See HPI  ALLERGIES: Allergies  Allergen Reactions   Dust Mite Extract    Mold Extract [Trichophyton]    Pollen Extract    Amoxicillin-Pot Clavulanate Other (See Comments)    GI intolerance    HOME MEDICATIONS: Outpatient Medications Prior to Visit  Medication Sig Dispense Refill   phenytoin (DILANTIN) 100 MG ER capsule Take 3 capsules (300 mg total) by mouth daily. 270 capsule 2   phenytoin (PHENYTOIN INFATABS) 50 MG tablet Chew 1 tablet (50 mg total) by mouth daily. 90 tablet 2   cholecalciferol (VITAMIN D) 1000 UNITS tablet Take 1,000 Units by mouth daily. (Patient not taking: Reported on 05/04/2023)     No facility-administered medications prior to visit.    PAST MEDICAL HISTORY: Past Medical History:  Diagnosis Date   Convulsions/seizures (HCC) 06/11/2013    PAST SURGICAL HISTORY: Past Surgical History:  Procedure Laterality Date   none      FAMILY HISTORY: Family History  Problem Relation Age of Onset   Seizures Mother    Seizures  Maternal Grandfather     SOCIAL HISTORY: Social History   Socioeconomic History   Marital status: Married    Spouse name: melissa   Number of children: 1   Years of education: college   Highest education level: Not on file  Occupational History    Employer: [employer]  Tobacco Use   Smoking status: Never   Smokeless tobacco: Never  Substance and Sexual Activity   Alcohol use: No   Drug use: No   Sexual activity: Not on file  Other Topics Concern   Not on file  Social History Narrative   Not on  file   Social Determinants of Health   Financial Resource Strain: Not on file  Food Insecurity: Not on file  Transportation Needs: Not on file  Physical Activity: Not on file  Stress: Not on file  Social Connections: Not on file  Intimate Partner Violence: Not on file   PHYSICAL EXAM  Vitals:   05/04/23 1402  BP: 128/82  Pulse: (!) 113  Weight: 236 lb 9.6 oz (107.3 kg)  Height: 5\' 10"  (1.778 m)   Body mass index is 33.95 kg/m.  Generalized: Well developed, in no acute distress   Neurological examination  Mentation: Alert oriented to time, place, history taking. Follows all commands speech and language fluent Cranial nerve II-XII: Pupils were equal round reactive to light. Extraocular movements were full, visual field were full on confrontational test. Facial sensation and strength were normal. Head turning and shoulder shrug  were normal and symmetric. Motor: The motor testing reveals 5 over 5 strength of all 4 extremities. Good symmetric motor tone is noted throughout.  Sensory: Sensory testing is intact to soft touch on all 4 extremities. No evidence of extinction is noted.  Coordination: Cerebellar testing reveals good finger-nose-finger and heel-to-shin bilaterally.  Gait and station: Gait is normal. Tandem gait is normal.  Reflexes: Deep tendon reflexes are symmetric and normal bilaterally.   DIAGNOSTIC DATA (LABS, IMAGING, TESTING) - I reviewed patient records, labs, notes, testing and imaging myself where available.  Lab Results  Component Value Date   WBC 7.0 12/03/2021   HGB 16.3 12/03/2021   HCT 45.5 12/03/2021   MCV 89 12/03/2021   PLT 174 12/03/2021      Component Value Date/Time   NA 140 12/03/2021 1623   K 4.0 12/03/2021 1623   CL 104 12/03/2021 1623   CO2 25 12/03/2021 1623   GLUCOSE 107 (H) 12/03/2021 1623   GLUCOSE 108 (H) 09/15/2013 0721   BUN 9 12/03/2021 1623   CREATININE 0.92 12/03/2021 1623   CALCIUM 9.5 12/03/2021 1623   PROT 7.2  12/03/2021 1623   ALBUMIN 4.2 12/03/2021 1623   AST 20 12/03/2021 1623   ALT 24 12/03/2021 1623   ALKPHOS 100 12/03/2021 1623   BILITOT 0.2 12/03/2021 1623   GFRNONAA 90 10/29/2020 1147   GFRAA 104 10/29/2020 1147   No results found for: "CHOL", "HDL", "LDLCALC", "LDLDIRECT", "TRIG", "CHOLHDL" No results found for: "HGBA1C" No results found for: "VITAMINB12" No results found for: "TSH"  ASSESSMENT AND PLAN 52 y.o. year old male  has a past medical history of Convulsions/seizures (HCC) (06/11/2013). here with:  1.  Seizure disorder  -Continues to do well, no recent seizures -Check labs today  -Continue Dilantin total 350 mg at bedtime -Call for seizures, return back in 1 year with Dr. Teresa Coombs to establish seizure care, then can come back to me  Margie Ege, AGNP-C, DNP 05/04/2023, 2:13 PM [facility] [address], [address] [place], [place] [address] [phone]

## 2023-05-04 ENCOUNTER — Ambulatory Visit: Payer: BC Managed Care – PPO | Admitting: Neurology

## 2023-05-04 ENCOUNTER — Encounter: Payer: Self-pay | Admitting: Neurology

## 2023-05-04 VITALS — BP 128/82 | HR 113 | Ht 70.0 in | Wt 236.6 lb

## 2023-05-04 DIAGNOSIS — R569 Unspecified convulsions: Secondary | ICD-10-CM | POA: Diagnosis not present

## 2023-05-04 MED ORDER — PHENYTOIN SODIUM EXTENDED 100 MG PO CAPS: 300.0000 mg | ORAL_CAPSULE | Freq: Every day | ORAL | 3 refills | Status: DC

## 2023-05-04 MED ORDER — PHENYTOIN 50 MG PO CHEW: 50.0000 mg | CHEWABLE_TABLET | Freq: Every day | ORAL | 3 refills | Status: DC

## 2023-05-04 NOTE — Patient Instructions (Signed)
Nice to see you today.  Continue current dose of Dilantin.  Will check labs.  Will see back in 1 year.  Thanks!!

## 2023-05-05 ENCOUNTER — Telehealth: Payer: Self-pay | Admitting: Neurology

## 2023-05-05 LAB — CBC WITH DIFFERENTIAL/PLATELET
Basophils Absolute: 0.1 10*3/uL (ref 0.0–0.2)
Basos: 1 %
EOS (ABSOLUTE): 0.2 10*3/uL (ref 0.0–0.4)
Eos: 2 %
Hematocrit: 45.2 % (ref 37.5–51.0)
Hemoglobin: 15.9 g/dL (ref 13.0–17.7)
Immature Grans (Abs): 0 10*3/uL (ref 0.0–0.1)
Immature Granulocytes: 0 %
Lymphocytes Absolute: 1.5 10*3/uL (ref 0.7–3.1)
Lymphs: 17 %
MCH: 31.1 pg (ref 26.6–33.0)
MCHC: 35.2 g/dL (ref 31.5–35.7)
MCV: 88 fL (ref 79–97)
Monocytes Absolute: 0.6 10*3/uL (ref 0.1–0.9)
Monocytes: 6 %
Neutrophils Absolute: 6.4 10*3/uL (ref 1.4–7.0)
Neutrophils: 74 %
Platelets: 162 10*3/uL (ref 150–450)
RBC: 5.12 x10E6/uL (ref 4.14–5.80)
RDW: 13.2 % (ref 11.6–15.4)
WBC: 8.7 10*3/uL (ref 3.4–10.8)

## 2023-05-05 LAB — COMPREHENSIVE METABOLIC PANEL
ALT: 37 IU/L (ref 0–44)
AST: 27 IU/L (ref 0–40)
Albumin: 4.6 g/dL (ref 3.8–4.9)
Alkaline Phosphatase: 114 IU/L (ref 44–121)
BUN/Creatinine Ratio: 10 (ref 9–20)
BUN: 10 mg/dL (ref 6–24)
Bilirubin Total: 0.3 mg/dL (ref 0.0–1.2)
CO2: 24 mmol/L (ref 20–29)
Calcium: 9.6 mg/dL (ref 8.7–10.2)
Chloride: 103 mmol/L (ref 96–106)
Creatinine, Ser: 0.98 mg/dL (ref 0.76–1.27)
Globulin, Total: 2.9 g/dL (ref 1.5–4.5)
Glucose: 95 mg/dL (ref 70–99)
Potassium: 4.2 mmol/L (ref 3.5–5.2)
Sodium: 143 mmol/L (ref 134–144)
Total Protein: 7.5 g/dL (ref 6.0–8.5)
eGFR: 92 mL/min/{1.73_m2} (ref >59)

## 2023-05-05 LAB — PHENYTOIN LEVEL, TOTAL: Phenytoin (Dilantin), Serum: 10.5 ug/mL (ref 10.0–20.0)

## 2023-05-05 LAB — VITAMIN D 25 HYDROXY (VIT D DEFICIENCY, FRACTURES): Vit D, 25-Hydroxy: 7.1 ng/mL — ABNORMAL LOW (ref 30.0–100.0)

## 2023-05-05 MED ORDER — VITAMIN D (ERGOCALCIFEROL) 1.25 MG (50000 UNIT) PO CAPS: 50000.0000 [IU] | ORAL_CAPSULE | ORAL | 0 refills | Status: DC

## 2023-05-05 NOTE — Telephone Encounter (Signed)
Faxed to pcp °

## 2023-05-05 NOTE — Telephone Encounter (Signed)
I sent the patient a MyChart message, vitamin D level very low 7.1.  I sent in prescription strength vitamin D.  Please fax labs over to PCP.  Rest of blood work is normal.  Thanks  Meds ordered this encounter  Medications   Vitamin D, Ergocalciferol, (DRISDOL) 1.25 MG (50000 UNIT) CAPS capsule    Sig: Take 1 capsule (50,000 Units total) by mouth every 7 (seven) days.    Dispense:  12 capsule    Refill:  0

## 2024-03-20 ENCOUNTER — Telehealth: Payer: Self-pay | Admitting: Neurology

## 2024-03-20 NOTE — Telephone Encounter (Signed)
 Patient reschedule appointment due [place] come to appointment.

## 2024-03-21 ENCOUNTER — Ambulatory Visit: Payer: BC Managed Care – PPO | Admitting: Neurology

## 2024-04-09 ENCOUNTER — Ambulatory Visit (INDEPENDENT_AMBULATORY_CARE_PROVIDER_SITE_OTHER): Admitting: Neurology

## 2024-04-09 ENCOUNTER — Encounter: Payer: Self-pay | Admitting: Neurology

## 2024-04-09 VITALS — BP 113/75 | HR 84 | Resp 15 | Ht 71.0 in | Wt 231.5 lb

## 2024-04-09 DIAGNOSIS — R569 Unspecified convulsions: Secondary | ICD-10-CM

## 2024-04-09 DIAGNOSIS — Z5181 Encounter for therapeutic drug level monitoring: Secondary | ICD-10-CM | POA: Diagnosis not present

## 2024-04-09 MED ORDER — PHENYTOIN 50 MG PO CHEW: 50.0000 mg | CHEWABLE_TABLET | Freq: Every day | ORAL | 3 refills | Status: DC

## 2024-04-09 MED ORDER — PHENYTOIN SODIUM EXTENDED 100 MG PO CAPS: 300.0000 mg | ORAL_CAPSULE | Freq: Every day | ORAL | 3 refills | Status: DC

## 2024-04-09 NOTE — Progress Notes (Signed)
 PATIENT: Marc Johnson DOB: November 27, 1970  REASON FOR VISIT: follow up for seizures HISTORY FROM: patient PRIMARY NEUROLOGIST: Dr. Gregg  HISTORY OF PRESENT ILLNESS: Today 04/09/24 Patient presents today for follow up. He is alone. Last visit was a year ago, since then, he has been doing well, denies any seizure or seizure like activity. He is compliant with Phenytoin  350 mg nightly, denies any side effects. Reports that his last seizure was 10 years ago in 2015 when they attempted to reduce his phenytoin .  No other complaints, reports that he is planning a trip to [place] next near and was wondering about interference between his phenytoin  and yellow fever vaccine.    UPDATE 05/04/2023 SS No seizures, remains on Dilantin  total 350 mg daily. Still working at [employer] call center.  Labs last visit March 2023, Dilantin  level 6.7. his last seizure was in 2015. Seizures started in his 30's. Have been generalized at worst, the most recent were zoning out. Has not been on any other seizure medications. 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   Update 12/03/21 SS: [name] here today for follow-up.  Dilantin  level was 9.6 at last visit. Doing well. Working new schedule at [employer] 8-5, no seizures, doing well. Also is a Education officer, environmental. Tolerates Dilantin  well. Has an 55 year old. Did mention he feels better when Dilantin  level is on lower end, feels clearer headed. In past was on Dilantin  400 mg daily. Is careful to not miss sleep. Takes Vitamin D .  Update 12/03/20 SS: [name] is a 53 year old male with history of seizure disorder.  He remains on Dilantin , no recurrent seizure in several years.  Came in March 2022, Dilantin  level was on the low end 5.7.  CBC, CMP were unremarkable, except random glucose was 125.  Unclear reason for low and Dilantin  level, lower than prior.  Has excellent compliance with medication.  Does report significant Covid infection in December, at the time of his last blood draw, his wife had just had  a hysterectomy, he hadn't been eating well, not much rest.  Continues to work full-time for [employer].  He has a 27-year-old son.  Indicates he has been doing great.  Weight has been stable from last on file in December 2019.  Here today for evaluation unaccompanied.  HISTORY 09/10/2019 SS: [name] is a 53 year old male with history of seizure disorder.  He remains on Dilantin  350 mg daily.  He has not had recurrent seizure in several years.  He is tolerating his medication without side effect. 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  He has a 23-year-old son.  He drives a car without difficulty.  He indicates his overall health has been well, even his allergies have been doing well.  He presents today for evaluation via virtual visit.   REVIEW OF SYSTEMS: Out of a complete 14 system review of symptoms, the patient complains only of the following symptoms, and all other reviewed systems are negative.  See HPI  ALLERGIES: Allergies  Allergen Reactions   Dust Mite Extract    Mold Extract [Trichophyton]    Pollen Extract    Amoxicillin-Pot Clavulanate Other (See Comments)    GI intolerance    HOME MEDICATIONS: Outpatient Medications Prior to Visit  Medication Sig Dispense Refill   cholecalciferol (VITAMIN D ) 1000 UNITS tablet Take 1,000 Units by mouth daily.     phenytoin  (DILANTIN ) 100 MG ER capsule Take 3 capsules (300 mg total) by mouth daily. 270  capsule 3   phenytoin  (PHENYTOIN  INFATABS) 50 MG tablet Chew 1 tablet (50 mg total) by mouth daily. 90 tablet 3   Vitamin D , Ergocalciferol , (DRISDOL ) 1.25 MG (50000 UNIT) CAPS capsule Take 1 capsule (50,000 Units total) by mouth every 7 (seven) days. 12 capsule 0   No facility-administered medications prior to visit.    PAST MEDICAL HISTORY: Past Medical History:  Diagnosis Date   Convulsions/seizures (HCC) 06/11/2013    PAST SURGICAL HISTORY: Past Surgical History:  Procedure Laterality Date   none       FAMILY HISTORY: Family History  Problem Relation Age of Onset   Seizures Mother    Seizures Maternal Grandfather     SOCIAL HISTORY: Social History   Socioeconomic History   Marital status: Married    Spouse name: Hospital doctor   Number of children: 1   Years of education: college   Highest education level: Not on file  Occupational History    Employer: [employer]  Tobacco Use   Smoking status: Never   Smokeless tobacco: Never  Substance and Sexual Activity   Alcohol use: No   Drug use: No   Sexual activity: Not on file  Other Topics Concern   Not on file  Social History Narrative   Not on file   Social Drivers of Health   Financial Resource Strain: Not on file  Food Insecurity: Not on file  Transportation Needs: Not on file  Physical Activity: Not on file  Stress: Not on file  Social Connections: Not on file  Intimate Partner Violence: Not on file   PHYSICAL EXAM  Vitals:   04/09/24 1519  BP: 113/75  Pulse: 84  Resp: 15  SpO2: 95%  Weight: 231 lb 8 oz (105 kg)  Height: 5' 11 (1.803 m)   Body mass index is 32.29 kg/m.  Generalized: Well developed, in no acute distress   Neurological examination  Mentation: Alert oriented to time, place, history taking. Follows all commands speech and language fluent Cranial nerve II-XII: Pupils were equal round reactive to light. Extraocular movements were full, visual field were full on confrontational test. Facial sensation and strength were normal. Head turning and shoulder shrug  were normal and symmetric. Motor: The motor testing reveals 5 over 5 strength of all 4 extremities. Good symmetric motor tone is noted throughout.  Sensory: Sensory testing is intact to soft touch on all 4 extremities. No evidence of extinction is noted.  Coordination: Cerebellar testing reveals good finger-nose-finger and heel-to-shin bilaterally.  Gait and station: Gait is normal. Tandem gait is normal.   DIAGNOSTIC DATA (LABS,  IMAGING, TESTING) - I reviewed patient records, labs, notes, testing and imaging myself where available.  Lab Results  Component Value Date   WBC 8.7 05/04/2023   HGB 15.9 05/04/2023   HCT 45.2 05/04/2023   MCV 88 05/04/2023   PLT 162 05/04/2023      Component Value Date/Time   NA 143 05/04/2023 1443   K 4.2 05/04/2023 1443   CL 103 05/04/2023 1443   CO2 24 05/04/2023 1443   GLUCOSE 95 05/04/2023 1443   GLUCOSE 108 (H) 09/15/2013 0721   BUN 10 05/04/2023 1443   CREATININE 0.98 05/04/2023 1443   CALCIUM 9.6 05/04/2023 1443   PROT 7.5 05/04/2023 1443   ALBUMIN 4.6 05/04/2023 1443   AST 27 05/04/2023 1443   ALT 37 05/04/2023 1443   ALKPHOS 114 05/04/2023 1443   BILITOT 0.3 05/04/2023 1443   GFRNONAA 90 10/29/2020 1147  GFRAA 104 10/29/2020 1147   No results found for: CHOL, HDL, LDLCALC, LDLDIRECT, TRIG, CHOLHDL No results found for: YHAJ8R No results found for: VITAMINB12 No results found for: TSH  ASSESSMENT AND PLAN 53 y.o. year old male  has a past medical history of Convulsions/seizures (HCC) (06/11/2013). here with:  1.  Seizure disorder  -Continues to do well, no recent seizures -Check labs today  -Continue Dilantin  total 350 mg at bedtime -Call for seizures, -In term of his schedule trip next year, yellow fever vaccine is safe with his phenytoin . Also okay for him to take malaria prophylaxis. Informed patient that we wont be prescribing these medications. He will need to setup care with PCP or visit a travelling clinic.  - Will provide him with a letter for work requesting noise cancelling head phone.     I have spent a total of 30 minutes dedicated to this patient today, preparing to see patient, performing a medically appropriate examination and evaluation, ordering tests and/or medications and procedures, and counseling and educating the patient/family/caregiver; independently interpreting result and communicating results to the  family/patient/caregiver; and documenting clinical information in the electronic medical record.   Pastor Falling, MD  04/09/2024, 3:24 PM [facility] [address], [address] [place], [place] [address] [phone]

## 2024-04-10 ENCOUNTER — Ambulatory Visit: Payer: Self-pay | Admitting: Neurology

## 2024-04-10 LAB — COMPREHENSIVE METABOLIC PANEL WITH GFR
ALT: 39 IU/L (ref 0–44)
AST: 24 IU/L (ref 0–40)
Albumin: 4.3 g/dL (ref 3.8–4.9)
Alkaline Phosphatase: 95 IU/L (ref 44–121)
BUN/Creatinine Ratio: 15 (ref 9–20)
BUN: 13 mg/dL (ref 6–24)
Bilirubin Total: 0.3 mg/dL (ref 0.0–1.2)
CO2: 22 mmol/L (ref 20–29)
Calcium: 9.3 mg/dL (ref 8.7–10.2)
Chloride: 106 mmol/L (ref 96–106)
Creatinine, Ser: 0.89 mg/dL (ref 0.76–1.27)
Globulin, Total: 2.8 g/dL (ref 1.5–4.5)
Glucose: 94 mg/dL (ref 70–99)
Potassium: 3.8 mmol/L (ref 3.5–5.2)
Sodium: 143 mmol/L (ref 134–144)
Total Protein: 7.1 g/dL (ref 6.0–8.5)
eGFR: 101 mL/min/1.73 (ref >59)

## 2024-04-10 LAB — PHENYTOIN LEVEL, TOTAL: Phenytoin (Dilantin), Serum: 8.2 ug/mL — ABNORMAL LOW (ref 10.0–20.0)

## 2024-04-12 NOTE — Telephone Encounter (Signed)
 Check with Angie for the form fee. Thanks

## 2024-05-29 ENCOUNTER — Telehealth: Payer: Self-pay | Admitting: Neurology

## 2024-05-29 NOTE — Telephone Encounter (Signed)
 FORM dropped off gave to [facility] and Patient Paid $50

## 2024-05-30 DIAGNOSIS — Z0289 Encounter for other administrative examinations: Secondary | ICD-10-CM

## 2024-06-20 ENCOUNTER — Other Ambulatory Visit: Payer: Self-pay | Admitting: Neurology

## 2024-06-20 NOTE — Telephone Encounter (Signed)
 Last seen on 04/10/23 Follow up scheduled 04/16/25

## 2024-06-21 ENCOUNTER — Other Ambulatory Visit: Payer: Self-pay | Admitting: Neurology

## 2025-04-16 ENCOUNTER — Ambulatory Visit: Admitting: Neurology
